# Patient Record
Sex: Female | Born: 1940 | Race: White | Hispanic: No | Smoking: Former smoker
Health system: Southern US, Community
[De-identification: ages and names within clinical notes are randomized; demographics above are authoritative.]

## PROBLEM LIST (undated history)

## (undated) DIAGNOSIS — E782 Mixed hyperlipidemia: Secondary | ICD-10-CM

## (undated) DIAGNOSIS — F0280 Dementia in other diseases classified elsewhere without behavioral disturbance: Principal | ICD-10-CM

## (undated) DIAGNOSIS — G309 Alzheimer's disease, unspecified: Secondary | ICD-10-CM

## (undated) DIAGNOSIS — I10 Essential (primary) hypertension: Secondary | ICD-10-CM

## (undated) DIAGNOSIS — F32A Depression, unspecified: Secondary | ICD-10-CM

## (undated) DIAGNOSIS — E119 Type 2 diabetes mellitus without complications: Secondary | ICD-10-CM

## (undated) DIAGNOSIS — F329 Major depressive disorder, single episode, unspecified: Secondary | ICD-10-CM

## (undated) DIAGNOSIS — F419 Anxiety disorder, unspecified: Secondary | ICD-10-CM

## (undated) HISTORY — PX: COLONOSCOPY: SHX174

## (undated) HISTORY — DX: Alzheimer's disease, unspecified: G30.9

## (undated) HISTORY — DX: Depression, unspecified: F32.A

## (undated) HISTORY — DX: Anxiety disorder, unspecified: F41.9

## (undated) HISTORY — PX: CATARACT EXTRACTION: SUR2

## (undated) HISTORY — DX: Type 2 diabetes mellitus without complications: E11.9

## (undated) HISTORY — DX: Essential (primary) hypertension: I10

## (undated) HISTORY — DX: Dementia in other diseases classified elsewhere without behavioral disturbance: F02.80

## (undated) HISTORY — DX: Major depressive disorder, single episode, unspecified: F32.9

## (undated) HISTORY — DX: Mixed hyperlipidemia: E78.2

## (undated) HISTORY — DX: Dementia in other diseases classified elsewhere, unspecified severity, without behavioral disturbance, psychotic disturbance, mood disturbance, and anxiety: F02.80

## (undated) HISTORY — PX: TUBAL LIGATION: SHX77

---

## 1998-06-29 ENCOUNTER — Ambulatory Visit (HOSPITAL_COMMUNITY): Admission: RE | Admit: 1998-06-29 | Discharge: 1998-06-29 | Payer: Self-pay | Admitting: Internal Medicine

## 1999-07-10 ENCOUNTER — Ambulatory Visit (HOSPITAL_COMMUNITY): Admission: RE | Admit: 1999-07-10 | Discharge: 1999-07-10 | Payer: Self-pay | Admitting: Internal Medicine

## 1999-07-10 ENCOUNTER — Encounter: Payer: Self-pay | Admitting: Internal Medicine

## 1999-12-10 ENCOUNTER — Encounter: Payer: Self-pay | Admitting: Internal Medicine

## 1999-12-10 ENCOUNTER — Ambulatory Visit (HOSPITAL_COMMUNITY): Admission: RE | Admit: 1999-12-10 | Discharge: 1999-12-10 | Payer: Self-pay | Admitting: Internal Medicine

## 2000-02-19 ENCOUNTER — Ambulatory Visit (HOSPITAL_COMMUNITY): Admission: RE | Admit: 2000-02-19 | Discharge: 2000-02-19 | Payer: Self-pay | Admitting: Gastroenterology

## 2000-02-19 ENCOUNTER — Encounter: Payer: Self-pay | Admitting: Gastroenterology

## 2000-02-29 ENCOUNTER — Encounter (INDEPENDENT_AMBULATORY_CARE_PROVIDER_SITE_OTHER): Payer: Self-pay | Admitting: Specialist

## 2000-02-29 ENCOUNTER — Ambulatory Visit (HOSPITAL_COMMUNITY): Admission: RE | Admit: 2000-02-29 | Discharge: 2000-02-29 | Payer: Self-pay | Admitting: Internal Medicine

## 2000-08-28 ENCOUNTER — Encounter: Payer: Self-pay | Admitting: Internal Medicine

## 2000-08-28 ENCOUNTER — Ambulatory Visit (HOSPITAL_COMMUNITY): Admission: RE | Admit: 2000-08-28 | Discharge: 2000-08-28 | Payer: Self-pay | Admitting: Internal Medicine

## 2001-07-16 ENCOUNTER — Encounter: Payer: Self-pay | Admitting: Internal Medicine

## 2001-07-16 ENCOUNTER — Ambulatory Visit (HOSPITAL_COMMUNITY): Admission: RE | Admit: 2001-07-16 | Discharge: 2001-07-16 | Payer: Self-pay | Admitting: Internal Medicine

## 2001-09-18 ENCOUNTER — Ambulatory Visit (HOSPITAL_COMMUNITY): Admission: RE | Admit: 2001-09-18 | Discharge: 2001-09-18 | Payer: Self-pay | Admitting: Internal Medicine

## 2001-09-18 ENCOUNTER — Encounter: Payer: Self-pay | Admitting: Internal Medicine

## 2001-12-30 ENCOUNTER — Other Ambulatory Visit: Admission: RE | Admit: 2001-12-30 | Discharge: 2001-12-30 | Payer: Self-pay | Admitting: Internal Medicine

## 2002-09-22 ENCOUNTER — Ambulatory Visit (HOSPITAL_COMMUNITY): Admission: RE | Admit: 2002-09-22 | Discharge: 2002-09-22 | Payer: Self-pay | Admitting: Internal Medicine

## 2002-09-22 ENCOUNTER — Encounter: Payer: Self-pay | Admitting: Internal Medicine

## 2003-03-30 ENCOUNTER — Ambulatory Visit (HOSPITAL_COMMUNITY): Admission: RE | Admit: 2003-03-30 | Discharge: 2003-03-30 | Payer: Self-pay | Admitting: Internal Medicine

## 2003-03-30 ENCOUNTER — Encounter: Payer: Self-pay | Admitting: Internal Medicine

## 2003-10-25 ENCOUNTER — Ambulatory Visit (HOSPITAL_COMMUNITY): Admission: RE | Admit: 2003-10-25 | Discharge: 2003-10-25 | Payer: Self-pay | Admitting: Internal Medicine

## 2004-02-20 ENCOUNTER — Other Ambulatory Visit: Admission: RE | Admit: 2004-02-20 | Discharge: 2004-02-20 | Payer: Self-pay | Admitting: Internal Medicine

## 2004-10-31 ENCOUNTER — Ambulatory Visit (HOSPITAL_COMMUNITY): Admission: RE | Admit: 2004-10-31 | Discharge: 2004-10-31 | Payer: Self-pay | Admitting: Internal Medicine

## 2004-11-08 ENCOUNTER — Encounter: Admission: RE | Admit: 2004-11-08 | Discharge: 2004-11-08 | Payer: Self-pay | Admitting: Internal Medicine

## 2005-12-18 ENCOUNTER — Ambulatory Visit (HOSPITAL_COMMUNITY): Admission: RE | Admit: 2005-12-18 | Discharge: 2005-12-18 | Payer: Self-pay | Admitting: Internal Medicine

## 2006-12-23 ENCOUNTER — Ambulatory Visit (HOSPITAL_COMMUNITY): Admission: RE | Admit: 2006-12-23 | Discharge: 2006-12-23 | Payer: Self-pay | Admitting: Internal Medicine

## 2007-12-28 ENCOUNTER — Ambulatory Visit (HOSPITAL_COMMUNITY): Admission: RE | Admit: 2007-12-28 | Discharge: 2007-12-28 | Payer: Self-pay | Admitting: Internal Medicine

## 2008-03-14 ENCOUNTER — Other Ambulatory Visit: Admission: RE | Admit: 2008-03-14 | Discharge: 2008-03-14 | Payer: Self-pay | Admitting: Internal Medicine

## 2008-03-31 ENCOUNTER — Encounter: Admission: RE | Admit: 2008-03-31 | Discharge: 2008-03-31 | Payer: Self-pay | Admitting: Internal Medicine

## 2008-04-18 LAB — HM DEXA SCAN

## 2008-11-25 HISTORY — PX: CARDIAC CATHETERIZATION: SHX172

## 2009-04-10 ENCOUNTER — Ambulatory Visit (HOSPITAL_COMMUNITY): Admission: RE | Admit: 2009-04-10 | Discharge: 2009-04-10 | Payer: Self-pay | Admitting: Internal Medicine

## 2009-05-16 ENCOUNTER — Encounter: Payer: Self-pay | Admitting: Cardiology

## 2009-08-29 ENCOUNTER — Encounter: Payer: Self-pay | Admitting: Cardiology

## 2009-09-08 ENCOUNTER — Ambulatory Visit: Payer: Self-pay | Admitting: Cardiology

## 2009-09-18 ENCOUNTER — Telehealth (INDEPENDENT_AMBULATORY_CARE_PROVIDER_SITE_OTHER): Payer: Self-pay | Admitting: *Deleted

## 2009-09-19 ENCOUNTER — Ambulatory Visit: Payer: Self-pay | Admitting: Cardiology

## 2009-09-19 ENCOUNTER — Encounter (HOSPITAL_COMMUNITY): Admission: RE | Admit: 2009-09-19 | Discharge: 2009-11-21 | Payer: Self-pay | Admitting: Cardiology

## 2009-09-19 ENCOUNTER — Ambulatory Visit: Payer: Self-pay

## 2009-09-29 ENCOUNTER — Ambulatory Visit: Payer: Self-pay | Admitting: Cardiology

## 2009-09-29 DIAGNOSIS — E782 Mixed hyperlipidemia: Secondary | ICD-10-CM | POA: Insufficient documentation

## 2009-10-02 ENCOUNTER — Encounter: Payer: Self-pay | Admitting: Cardiology

## 2009-10-02 LAB — CONVERTED CEMR LAB
BUN: 15 mg/dL (ref 6–23)
Basophils Absolute: 0.1 10*3/uL (ref 0.0–0.1)
Basophils Relative: 0.9 % (ref 0.0–3.0)
CO2: 30 meq/L (ref 19–32)
Calcium: 9.7 mg/dL (ref 8.4–10.5)
Chloride: 104 meq/L (ref 96–112)
Creatinine, Ser: 0.8 mg/dL (ref 0.4–1.2)
Eosinophils Absolute: 0.2 10*3/uL (ref 0.0–0.7)
Eosinophils Relative: 2.8 % (ref 0.0–5.0)
GFR calc non Af Amer: 75.93 mL/min (ref >60)
Glucose, Bld: 68 mg/dL — ABNORMAL LOW (ref 70–99)
HCT: 38.5 % (ref 36.0–46.0)
Hemoglobin: 13.1 g/dL (ref 12.0–15.0)
INR: 0.9 (ref 0.8–1.0)
Lymphocytes Relative: 35.7 % (ref 12.0–46.0)
Lymphs Abs: 2.3 10*3/uL (ref 0.7–4.0)
MCHC: 34.1 g/dL (ref 30.0–36.0)
MCV: 95.2 fL (ref 78.0–100.0)
Monocytes Absolute: 0.6 10*3/uL (ref 0.1–1.0)
Monocytes Relative: 9.3 % (ref 3.0–12.0)
Neutro Abs: 3.3 10*3/uL (ref 1.4–7.7)
Neutrophils Relative %: 51.3 % (ref 43.0–77.0)
Platelets: 316 10*3/uL (ref 150.0–400.0)
Potassium: 4 meq/L (ref 3.5–5.1)
Prothrombin Time: 9.7 s (ref 9.1–11.7)
RBC: 4.04 M/uL (ref 3.87–5.11)
RDW: 11.6 % (ref 11.5–14.6)
Sodium: 142 meq/L (ref 135–145)
WBC: 6.5 10*3/uL (ref 4.5–10.5)

## 2009-10-03 ENCOUNTER — Inpatient Hospital Stay (HOSPITAL_BASED_OUTPATIENT_CLINIC_OR_DEPARTMENT_OTHER): Admission: RE | Admit: 2009-10-03 | Discharge: 2009-10-03 | Payer: Self-pay | Admitting: Cardiology

## 2009-10-03 ENCOUNTER — Ambulatory Visit: Payer: Self-pay | Admitting: Cardiology

## 2009-10-25 ENCOUNTER — Encounter: Payer: Self-pay | Admitting: Cardiology

## 2009-10-26 ENCOUNTER — Ambulatory Visit: Payer: Self-pay | Admitting: Cardiology

## 2010-04-12 ENCOUNTER — Ambulatory Visit (HOSPITAL_COMMUNITY): Admission: RE | Admit: 2010-04-12 | Discharge: 2010-04-12 | Payer: Self-pay | Admitting: Internal Medicine

## 2010-05-15 ENCOUNTER — Encounter: Admission: RE | Admit: 2010-05-15 | Discharge: 2010-05-15 | Payer: Self-pay | Admitting: Neurology

## 2010-05-17 ENCOUNTER — Encounter: Payer: Self-pay | Admitting: Gastroenterology

## 2010-05-21 ENCOUNTER — Encounter (INDEPENDENT_AMBULATORY_CARE_PROVIDER_SITE_OTHER): Payer: Self-pay | Admitting: *Deleted

## 2010-06-15 ENCOUNTER — Encounter (INDEPENDENT_AMBULATORY_CARE_PROVIDER_SITE_OTHER): Payer: Self-pay | Admitting: *Deleted

## 2010-06-18 ENCOUNTER — Encounter (INDEPENDENT_AMBULATORY_CARE_PROVIDER_SITE_OTHER): Payer: Self-pay | Admitting: *Deleted

## 2010-06-18 ENCOUNTER — Ambulatory Visit: Payer: Self-pay | Admitting: Gastroenterology

## 2010-07-02 ENCOUNTER — Ambulatory Visit: Payer: Self-pay | Admitting: Gastroenterology

## 2010-12-27 NOTE — Letter (Signed)
Summary: Diabetic Instructions  [facility]  [address] [place], [place] [address]   Phone: [phone]  Fax: [phone]    Amy [name] 08/27/1941 MRN: [id number]   _  _   ORAL DIABETIC MEDICATION INSTRUCTIONS  The day before your procedure:   Take your diabetic pill as you do normally  The day of your procedure:   Do not take your diabetic pill    We will check your blood sugar levels during the admission process and again in Recovery before discharging you home  ________________________________________________________________________  _  _   INSULIN (LONG ACTING) MEDICATION INSTRUCTIONS (Lantus, NPH, 70/30, Humulin, Novolin-N)   The day before your procedure:   Take  your regular evening dose    The day of your procedure:   Do not take your morning dose    _  _   INSULIN (SHORT ACTING) MEDICATION INSTRUCTIONS (Regular, Humulog, Novolog)   The day before your procedure:   Do not take your evening dose   The day of your procedure:   Do not take your morning dose   _  _   INSULIN PUMP MEDICATION INSTRUCTIONS  We will contact the physician managing your diabetic care for written dosage instructions for the day before your procedure and the day of your procedure.  Once we have received the instructions, we will contact you.

## 2010-12-27 NOTE — Letter (Signed)
Summary: [facility] Instructions  [facility]  [address] [place], [place] [address]   Phone: [phone]  Fax: [phone]       Amy [name]    02-16-1941    MRN: [id number]        Procedure Day /Date:  07/02/10 Monday     Arrival Time:  8:30am      Procedure Time: 9:30am     Location of Procedure:                    _ x_  [facility] (4th Floor)                        PREPARATION FOR COLONOSCOPY WITH MOVIPREP   Starting 5 days prior to your procedure _ 5/3/11_ do not eat nuts, seeds, popcorn, corn, beans, peas,  salads, or any raw vegetables.  Do not take any fiber supplements (e.g. Metamucil, Citrucel, and Benefiber).  THE DAY BEFORE YOUR PROCEDURE         DATE:  07/01/10  DAY:  Sunday  1.  Drink clear liquids the entire day-NO SOLID FOOD  2.  Do not drink anything colored red or purple.  Avoid juices with pulp.  No orange juice.  3.  Drink at least 64 oz. (8 glasses) of fluid/clear liquids during the day to prevent dehydration and help the prep work efficiently.  CLEAR LIQUIDS INCLUDE: Water Jello Ice Popsicles Tea (sugar ok, no milk/cream) Powdered fruit flavored drinks Coffee (sugar ok, no milk/cream) Gatorade Juice: apple, white grape, white cranberry  Lemonade Clear bullion, consomm, broth Carbonated beverages (any kind) Strained chicken noodle soup Hard Candy                             4.  In the morning, mix first dose of MoviPrep solution:    Empty 1 Pouch A and 1 Pouch B into the disposable container    Add lukewarm drinking water to the top line of the container. Mix to dissolve    Refrigerate (mixed solution should be used within 24 hrs)  5.  Begin drinking the prep at 5:00 p.m. The MoviPrep container is divided by 4 marks.   Every 15 minutes drink the solution down to the next mark (approximately 8 oz) until the full liter is complete.   6.  Follow completed prep with 16 oz of clear liquid of your choice (Nothing  red or purple).  Continue to drink clear liquids until bedtime.  7.  Before going to bed, mix second dose of MoviPrep solution:    Empty 1 Pouch A and 1 Pouch B into the disposable container    Add lukewarm drinking water to the top line of the container. Mix to dissolve    Refrigerate  THE DAY OF YOUR PROCEDURE      DATE:   07/02/10  DAY:  Monday  Beginning at  4:30 a.m. (5 hours before procedure):         1. Every 15 minutes, drink the solution down to the next mark (approx 8 oz) until the full liter is complete.  2. Follow completed prep with 16 oz. of clear liquid of your choice.    3. You may drink clear liquids until  7:30am (2 HOURS BEFORE PROCEDURE).   MEDICATION INSTRUCTIONS  Unless otherwise instructed, you should take regular prescription medications with a small sip of water  as early as possible the morning of your procedure.  Diabetic patients - see separate instructions.  Stop taking Plavix or Aggrenox on  _  _  (7 days before procedure).     Stop taking Coumadin on  _ _  (5 days before procedure).  Additional medication instructions: _         OTHER INSTRUCTIONS  You will need a responsible adult at least 69 years of age to accompany you and drive you home.   This person must remain in the waiting room during your procedure.  Wear loose fitting clothing that is easily removed.  Leave jewelry and other valuables at home.  However, you may wish to bring a book to read or  an iPod/MP3 player to listen to music as you wait for your procedure to start.  Remove all body piercing jewelry and leave at home.  Total time from sign-in until discharge is approximately 2-3 hours.  You should go home directly after your procedure and rest.  You can resume normal activities the  day after your procedure.  The day of your procedure you should not:   Drive   Make legal decisions   Operate machinery   Drink alcohol   Return to work  You will receive  specific instructions about eating, activities and medications before you leave.    The above instructions have been reviewed and explained to me by   _______________________    I fully understand and can verbalize these instructions _____________________________ Date _________

## 2010-12-27 NOTE — Letter (Signed)
Summary: Previsit letter  [facility]  [address] [place], [place] [address]   Phone: [phone]  Fax: [phone]       05/21/2010 MRN: [id number]  [name] [address] [place], [place]  [address]  Dear Ms. Amy [name],  Welcome to the Gastroenterology Division at [facility].    You are scheduled to see a nurse for your pre-procedure visit on 06-18-10 at 1:30p.m. on the 3rd floor at [facility], [address]  We ask that you try to arrive at our office 15 minutes prior to your appointment time to allow for check-in.  Your nurse visit will consist of discussing your medical and surgical history, your immediate family medical history, and your medications.    Please bring a complete list of all your medications or, if you prefer, bring the medication bottles and we will list them.  We will need to be aware of both prescribed and over the counter drugs.  We will need to know exact dosage information as well.  If you are on blood thinners (Coumadin, Plavix, Aggrenox, Ticlid, etc.) please call our office today/prior to your appointment, as we need to consult with your physician about holding your medication.   Please be prepared to read and sign documents such as consent forms, a financial agreement, and acknowledgement forms.  If necessary, and with your consent, a friend or relative is welcome to sit-in on the nurse visit with you.  Please bring your insurance card so that we may make a copy of it.  If your insurance requires a referral to see a specialist, please bring your referral form from your primary care physician.  No co-pay is required for this nurse visit.     If you cannot keep your appointment, please call [phone] to cancel or reschedule prior to your appointment date.  This allows [place] the opportunity to schedule an appointment for another patient in need of care.    Thank you for choosing [facility] for your medical  needs.  We appreciate the opportunity to care for you.  Please visit [place] at our website  to learn more about our practice.                     Sincerely.                                                                                                                   The Gastroenterology Division

## 2010-12-27 NOTE — Letter (Signed)
Summary: [facility] Instructions  [facility]  [address] [place], [place] [address]   Phone: [phone]  Fax: [phone]       Annamaria Herzig    27-Jun-1954    MRN: [id number]        Procedure Day Dorna Bloom   07/02/10  Monday     Arrival Time:  8:30am      Procedure Time: 9:30am     Location of Procedure:                    _x _  [facility] (4th Floor)                        PREPARATION FOR COLONOSCOPY WITH MOVIPREP   Starting 5 days prior to your procedure _8/3/11 _ do not eat nuts, seeds, popcorn, corn, beans, peas,  salads, or any raw vegetables.  Do not take any fiber supplements (e.g. Metamucil, Citrucel, and Benefiber).  THE DAY BEFORE YOUR PROCEDURE         DATE:  07/01/10  DAY: Sunday  1.  Drink clear liquids the entire day-NO SOLID FOOD  2.  Do not drink anything colored red or purple.  Avoid juices with pulp.  No orange juice.  3.  Drink at least 64 oz. (8 glasses) of fluid/clear liquids during the day to prevent dehydration and help the prep work efficiently.  CLEAR LIQUIDS INCLUDE: Water Jello Ice Popsicles Tea (sugar ok, no milk/cream) Powdered fruit flavored drinks Coffee (sugar ok, no milk/cream) Gatorade Juice: apple, white grape, white cranberry  Lemonade Clear bullion, consomm, broth Carbonated beverages (any kind) Strained chicken noodle soup Hard Candy                             4.  In the morning, mix first dose of MoviPrep solution:    Empty 1 Pouch A and 1 Pouch B into the disposable container    Add lukewarm drinking water to the top line of the container. Mix to dissolve    Refrigerate (mixed solution should be used within 24 hrs)  5.  Begin drinking the prep at 5:00 p.m. The MoviPrep container is divided by 4 marks.   Every 15 minutes drink the solution down to the next mark (approximately 8 oz) until the full liter is complete.   6.  Follow completed prep with 16 oz of clear liquid of your choice (Nothing  red or purple).  Continue to drink clear liquids until bedtime.  7.  Before going to bed, mix second dose of MoviPrep solution:    Empty 1 Pouch A and 1 Pouch B into the disposable container    Add lukewarm drinking water to the top line of the container. Mix to dissolve    Refrigerate  THE DAY OF YOUR PROCEDURE      DATE:  07/02/10  DAY:  Monday  Beginning at   4:30 a.m. (5 hours before procedure):         1. Every 15 minutes, drink the solution down to the next mark (approx 8 oz) until the full liter is complete.  2. Follow completed prep with 16 oz. of clear liquid of your choice.    3. You may drink clear liquids until 7:30am  (2 HOURS BEFORE PROCEDURE).   MEDICATION INSTRUCTIONS  Unless otherwise instructed, you should take regular prescription medications with a small sip of  water   as early as possible the morning of your procedure.  Diabetic patients - see separate instructions.           OTHER INSTRUCTIONS  You will need a responsible adult at least 69 years of age to accompany you and drive you home.   This person must remain in the waiting room during your procedure.  Wear loose fitting clothing that is easily removed.  Leave jewelry and other valuables at home.  However, you may wish to bring a book to read or  an iPod/MP3 player to listen to music as you wait for your procedure to start.  Remove all body piercing jewelry and leave at home.  Total time from sign-in until discharge is approximately 2-3 hours.  You should go home directly after your procedure and rest.  You can resume normal activities the  day after your procedure.  The day of your procedure you should not:   Drive   Make legal decisions   Operate machinery   Drink alcohol   Return to work  You will receive specific instructions about eating, activities and medications before you leave.    The above instructions have been reviewed and explained to me by   Clide Cliff,  RN______________________    I fully understand and can verbalize these instructions _____________________________ Date _________

## 2010-12-27 NOTE — Procedures (Signed)
Summary: Colonoscopy  Patient: Amy [name] Note: All result statuses are Final unless otherwise noted.  Tests: (1) Colonoscopy (COL)   COL Colonoscopy           DONE     [facility]     [address]     [place], [place]  [address]           COLONOSCOPY PROCEDURE REPORT           PATIENT:  Amy [name]  MR#:  [id number]     BIRTHDATE:  [birth year], 69 yrs. old  GENDER:  female           ENDOSCOPIST:  Barbette Hair. Arlyce Dice, MD     Referred by:           PROCEDURE DATE:  07/02/2010     PROCEDURE:  Diagnostic Colonoscopy     ASA CLASS:  Class II     INDICATIONS:  1) screening  2) history of pre-cancerous     (adenomatous) colon polyps           MEDICATIONS:   Fentanyl 50 mcg IV, Versed 3 mg IV           DESCRIPTION OF PROCEDURE:   After the risks benefits and     alternatives of the procedure were thoroughly explained, informed     consent was obtained.  Digital rectal exam was performed and     revealed no abnormalities.   The LB CF-[id number] endoscope     was introduced through the anus and advanced to the cecum, which     was identified by both the appendix and ileocecal valve, without     limitations.  The quality of the prep was excellent, using     MoviPrep.  The instrument was then slowly withdrawn as the colon     was fully examined.     <<PROCEDUREIMAGES>>           FINDINGS:  Diverticula were found in the ascending colon (see     image4). Few diverticula  Moderate diverticulosis was found in the     sigmoid colon (see image15).  This was otherwise a normal     examination of the colon (see image2, image3, image5, image7,     image9, image11, image16, and image17).   Retroflexed views in the     rectum revealed no abnormalities.    The time to cecum =  4.0     minutes. The scope was then withdrawn (time =  8.5  min) from the     patient and the procedure completed.           COMPLICATIONS:  None           ENDOSCOPIC IMPRESSION:     1) Diverticula in  the ascending colon     2) Moderate diverticulosis in the sigmoid colon     3) Otherwise normal examination     RECOMMENDATIONS:     1) Colonoscopy 10 years           REPEAT EXAM:  In 10 year(s) for Colonoscopy.           ______________________________     Barbette Hair. Arlyce Dice, MD           CC: Amy Morale, MD           n.     Amy [name]:   Barbette Hair. Kaplan at 07/02/2010 10:03 AM  Amy [name], [id number]  Note: An exclamation mark (!) indicates a result that was not dispersed into the flowsheet. Document Creation Date: 07/02/2010 10:04 AM _______________________________________________________________________  (1) Order result status: Final Collection or observation date-time: 07/02/2010 09:58 Requested date-time:  Receipt date-time:  Reported date-time:  Referring Physician:   Ordering Physician: Melvia Heaps [phone]) Specimen Source:  Source: Launa Grill Order Number: [id number] Lab site:   Appended Document: Colonoscopy    Clinical Lists Changes  Observations: Added new observation of COLONNXTDUE: 06/2020 (07/02/2010 14:16)

## 2010-12-27 NOTE — Letter (Signed)
Summary: Diabetic Instructions  [facility]  [address]   [place], [place] [address]   Phone: [phone]  Fax: [phone]    Amy [name] 03/03/1941 MRN: [id number]   _  _   ORAL DIABETIC MEDICATION INSTRUCTIONS  The day before your procedure:   Take your diabetic pill as you do normally  The day of your procedure:   Do not take your diabetic pill    We will check your blood sugar levels during the admission process and again in Recovery before discharging you home  ________________________________________________________________________  _  _   INSULIN (LONG ACTING) MEDICATION INSTRUCTIONS (Lantus, NPH, 70/30, Humulin, Novolin-N)   The day before your procedure:   Take  your regular evening dose    The day of your procedure:   Do not take your morning dose    _  _   INSULIN (SHORT ACTING) MEDICATION INSTRUCTIONS (Regular, Humulog, Novolog)   The day before your procedure:   Do not take your evening dose   The day of your procedure:   Do not take your morning dose   _  _   INSULIN PUMP MEDICATION INSTRUCTIONS  We will contact the physician managing your diabetic care for written dosage instructions for the day before your procedure and the day of your procedure.  Once we have received the instructions, we will contact you.  

## 2010-12-27 NOTE — Miscellaneous (Signed)
Summary: recall colon--ch  Clinical Lists Changes  Medications: Added new medication of MOVIPREP 100 GM  SOLR (PEG-KCL-NACL-NASULF-NA ASC-C) As directed - Signed Rx of MOVIPREP 100 GM  SOLR (PEG-KCL-NACL-NASULF-NA ASC-C) As directed;  #1 x 0;  Signed;  Entered by: Clide Cliff RN;  Authorized by: Louis Meckel MD;  Method used: Electronically to [facility] [address] # [id number]*, [address] [place], [place], [place]  [address], Ph: [phone] or [phone], Fax: [phone]    Prescriptions: MOVIPREP 100 GM  SOLR (PEG-KCL-NACL-NASULF-NA ASC-C) As directed  #1 x 0   Entered by:   Clide Cliff RN   Authorized by:   Louis Meckel MD   Signed by:   Clide Cliff RN on 06/18/2010   Method used:   Electronically to        [facility] [address] [id number] (retail)       [address]       [place], [place]  [address]       Ph: [phone] or [phone]       Fax: [phone]   RxID:   [id number]

## 2010-12-27 NOTE — Letter (Signed)
Summary: Marinus Maw MD  Marinus Maw MD   Imported By: Lester [place] 05/30/2010 09:33:22  _____________________________________________________________________  External Attachment:    Type:   Image     Comment:   External Document

## 2011-01-18 ENCOUNTER — Emergency Department (HOSPITAL_COMMUNITY): Payer: Medicare Other

## 2011-01-18 ENCOUNTER — Inpatient Hospital Stay (HOSPITAL_COMMUNITY)
Admission: EM | Admit: 2011-01-18 | Discharge: 2011-01-19 | DRG: 639 | Disposition: A | Discharge status: Home / Self Care | Payer: Medicare Other | Attending: Internal Medicine | Admitting: Internal Medicine

## 2011-01-18 DIAGNOSIS — E1169 Type 2 diabetes mellitus with other specified complication: Principal | ICD-10-CM | POA: Diagnosis present

## 2011-01-18 DIAGNOSIS — Y9229 Other specified public building as the place of occurrence of the external cause: Secondary | ICD-10-CM

## 2011-01-18 DIAGNOSIS — R55 Syncope and collapse: Secondary | ICD-10-CM | POA: Diagnosis present

## 2011-01-18 DIAGNOSIS — I10 Essential (primary) hypertension: Secondary | ICD-10-CM | POA: Diagnosis present

## 2011-01-18 DIAGNOSIS — T383X5A Adverse effect of insulin and oral hypoglycemic [antidiabetic] drugs, initial encounter: Secondary | ICD-10-CM | POA: Diagnosis present

## 2011-01-18 LAB — GLUCOSE, CAPILLARY
Glucose-Capillary: 144 mg/dL — ABNORMAL HIGH (ref 70–99)
Glucose-Capillary: 136 mg/dL — ABNORMAL HIGH (ref 70–99)

## 2011-01-18 LAB — CBC
HCT: 35.9 % — ABNORMAL LOW (ref 36.0–46.0)
Hemoglobin: 11.6 g/dL — ABNORMAL LOW (ref 12.0–15.0)
MCH: 29.7 pg (ref 26.0–34.0)
MCHC: 32.3 g/dL (ref 30.0–36.0)
MCV: 92.1 fL (ref 78.0–100.0)
Platelets: 304 10*3/uL (ref 150–400)
RBC: 3.9 MIL/uL (ref 3.87–5.11)
RDW: 12.7 % (ref 11.5–15.5)
WBC: 8.6 10*3/uL (ref 4.0–10.5)

## 2011-01-18 LAB — CK TOTAL AND CKMB (NOT AT ARMC)
CK, MB: 2.2 ng/mL (ref 0.3–4.0)
CK, MB: 1.8 ng/mL (ref 0.3–4.0)
Relative Index: INVALID (ref 0.0–2.5)
Total CK: 61 U/L (ref 7–177)

## 2011-01-18 LAB — BASIC METABOLIC PANEL
BUN: 13 mg/dL (ref 6–23)
CO2: 25 mEq/L (ref 19–32)
Calcium: 9 mg/dL (ref 8.4–10.5)
Creatinine, Ser: 0.75 mg/dL (ref 0.4–1.2)
GFR calc non Af Amer: >60 mL/min (ref >60)
Glucose, Bld: 95 mg/dL (ref 70–99)
Potassium: 4 mEq/L (ref 3.5–5.1)
Sodium: 138 mEq/L (ref 135–145)

## 2011-01-18 LAB — DIFFERENTIAL
Basophils Absolute: 0.1 10*3/uL (ref 0.0–0.1)
Basophils Relative: 1 % (ref 0–1)
Eosinophils Absolute: 0.1 10*3/uL (ref 0.0–0.7)
Eosinophils Relative: 1 % (ref 0–5)
Lymphocytes Relative: 22 % (ref 12–46)
Lymphs Abs: 1.9 10*3/uL (ref 0.7–4.0)
Monocytes Absolute: 0.9 10*3/uL (ref 0.1–1.0)
Neutrophils Relative %: 66 % (ref 43–77)

## 2011-01-18 LAB — URINALYSIS, ROUTINE W REFLEX MICROSCOPIC
Bilirubin Urine: NEGATIVE
Ketones, ur: NEGATIVE mg/dL
Nitrite: NEGATIVE
Protein, ur: NEGATIVE mg/dL
Urine Glucose, Fasting: NEGATIVE mg/dL
Urobilinogen, UA: 0.2 mg/dL (ref 0.0–1.0)
pH: 6.5 (ref 5.0–8.0)

## 2011-01-18 LAB — CARDIAC PANEL(CRET KIN+CKTOT+MB+TROPI)
CK, MB: 1.8 ng/mL (ref 0.3–4.0)
Relative Index: INVALID (ref 0.0–2.5)
Total CK: 68 U/L (ref 7–177)

## 2011-01-18 LAB — TROPONIN I
Troponin I: 0.01 ng/mL (ref 0.00–0.06)
Troponin I: <0.01 ng/mL (ref 0.00–0.06)

## 2011-01-19 LAB — GLUCOSE, CAPILLARY
Glucose-Capillary: 93 mg/dL (ref 70–99)
Glucose-Capillary: 104 mg/dL — ABNORMAL HIGH (ref 70–99)

## 2011-01-19 LAB — CARDIAC PANEL(CRET KIN+CKTOT+MB+TROPI)
CK, MB: 1.6 ng/mL (ref 0.3–4.0)
Total CK: 55 U/L (ref 7–177)

## 2011-01-31 NOTE — Discharge Summary (Signed)
Amy [name], Amy [name]               ACCOUNT NO.:  [id number]  MEDICAL RECORD NO.:  [id number]           PATIENT TYPE:  I  LOCATION:  1426                         FACILITY:  [facility]  PHYSICIAN:  Ladell Pier, M.D.   DATE OF BIRTH:  Apr 15, 1941  DATE OF ADMISSION:  01/18/2011 DATE OF DISCHARGE:  01/19/2011                              DISCHARGE SUMMARY   DISCHARGE DIAGNOSES: 1. Syncopal episode secondary to hypoglycemia. 2. Diabetes. 3. Hypertension. 4. History of chest pain in 2010 with cardiac catheterization done     that was negative. 5. Family history of breast cancer.  DISCHARGE MEDICATIONS: 1. Aspirin 81 mg daily. 2. Bumetanide 0.5 mg daily. 3. Enalapril 20 mg daily. 4. Fenofibrate 134 mg daily. 5. Fish oil one tablet daily. 6. Magnesium OTC three times a day. 7. Metformin XR 500 mg four times a day. 8. Multivitamin daily. 9. Tamoxifen 20 mg daily. 10.Vitamin B12 over-the-counter one tablet daily. 11.Vitamin D3 2000 international units four times daily.  FOLLOWUP APPOINTMENTS:  The patient to follow up with Dr. Oneta Rack either Monday or Tuesday.  PRIMARY CARE PHYSICIAN:  Lucky Cowboy, M.D.  PROCEDURES:  Head CT and chest x-ray are both negative.  CONSULTATIONS:  None.  HISTORY OF PRESENT ILLNESS:  The patient is a 70 year old female with past medical history significant for diabetes and hypertension.  The patient stated that she had some fruit this morning and coffee and [facility].  She also took her [facility]  She had just finished some aerobic chair exercises and was getting ready to go to another class when she passed out while sitting in the chair.  The chair fell over with her and she bumped her head on the left forehead.  EMS was called and she was noted to have a blood sugar of 58.  The patient had no chest pain, no shortness of breath, no nausea, no vomiting and no prodrome prior to  her getting the symptoms.  She did not bite her tongue.  PAST MEDICAL HISTORY:  Per admission H and P.  FAMILY HISTORY:  Per admission H and P.  SOCIAL HISTORY:  Per admission H and P.  MEDICATIONS:  Per admission H and P.  ALLERGIES:  Per admission H and P.  REVIEW OF SYSTEMS:  Per admission H and P.  PHYSICAL EXAMINATION AT TIME OF DISCHARGE:  VITAL SIGNS:  Temperature 98, pulse of 75, respirations 16, blood pressure 132/74, pulse oximetry of 98% on room air. GENERAL:  The patient is sitting up in bed, well-nourished white female. HEENT:  Normocephalic, atraumatic.  Pupils reactive to light.  Throat without erythema.  She has a small hematoma on the left upper left forehead region. CARDIOVASCULAR:  Regular rate and rhythm. LUNGS:  Clear bilaterally. ABDOMEN:  Positive bowel sounds. EXTREMITIES:  Without edema.  HOSPITAL COURSE: 1. Syncopal episode:  Her syncopal episode is most likely due to     hypoglycemia from medication.  She took her medications and did not     really eat a big breakfast and went to  work out, so the     hypoglycemic episode is probably from her medication.  She did not     have any chest pain.  No shortness of breath.  No headache.  No     stroke symptoms.  No seizure symptoms.  We will hold her glyburide     for now, continue the metformin and she will follow with Dr.     Oneta Rack this week coming up to see if he wants to do any further     exam, but here her EKG is normal, cardiac markers are normal, head     CT is normal.  We will not do any further workup. 2. Diabetes:  Continued her metformin.  Her blood sugars have been     running fine here with just the metformin, so we will hold off on     the glyburide until she follows up with Dr. Oneta Rack.  Her sugars     are 93 to 104.  We did not do a hemoglobin A1c. 3. Hypertension:  Continue her on her home medications.  DISCHARGE LABORATORIES:  CK 56, MB 1.6, troponin 0.01, TSH 3.539.  BNP 138,  potassium 4.0, chloride 105, CO2 25, glucose 95, BUN 13, creatinine 0.75.  WBC 8.6, hemoglobin 11.6, MCV 92.1, platelets 304.  Time spent with the patient and daughter and doing this discharge is approximately 35 minutes.     Ladell Pier, M.D.     [place]/MEDQ  D:  01/19/2011  T:  01/19/2011  Job:  [id number]  cc:   Lucky Cowboy, M.D. Fax: [phone]  Electronically Signed by Ladell Pier M.D. on 01/31/2011 07:56:00 AM

## 2011-02-01 NOTE — H&P (Signed)
Amy [name], LATINO               ACCOUNT NO.:  [id number]  MEDICAL RECORD NO.:  [id number]           PATIENT TYPE:  E  LOCATION:  WLED                         FACILITY:  [facility]  PHYSICIAN:  Ladell Pier, M.D.   DATE OF BIRTH:  07-09-1941  DATE OF ADMISSION:  01/18/2011 DATE OF DISCHARGE:                             HISTORY & PHYSICAL   CHIEF COMPLAINT:  [facility]  HISTORY OF PRESENT ILLNESS:  The patient is a 70 year old female with past medical history significant for diabetes, hypertension.  The patient stated that she had some fruit this morning and some coffee and [facility].  [facility] do [facility] aerobics and right after she finished her exercises from that class, she was going to go to another class because she had missed it a few days.  She stated the next thing I know people were standing over her.  She was on the floor.  They told her that she just passed out and the chair fell over with her.  She hit her head.  She does not remember any chest pain.  No shortness of breath.  No prodrome of the fall.  EMS was called and she was noted to have a blood sugar of 58.  She stated that she did take her metformin and her glyburide prior to going to her exercise.  PAST MEDICAL HISTORY:  Significant for, 1. Diabetes. 2. Hypertension. 3. She had chest pain in 2010, had a cardiac cath that was negative     and a family history of breast cancer.  FAMILY HISTORY:  Mother died at 39 of breast cancer.  Father died of a stroke at 60.  SOCIAL HISTORY:  She does not smoke or drink alcohol.  She is widowed. She has 2 children.  She is retired.  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  MEDICATIONS: 1. Magnesium over-the-counter three times a day. 2. Vitamin B12 daily. 3. Multivitamin daily. 4. Fish oil daily. 5. Vitamin D3 2000 units four times daily. 6. Aspirin 81 mg daily. 7. Tamoxifen 20 mg every evening. 8. Bumetanide 0.5 mg  daily. 9. Glyburide 5 mg half a tablet twice daily. 10.Enalapril 20 mg every evening. 11.Metformin XR 500 mg four times daily. 12.Fenofibrate 134 mg daily.  ALLERGIES:  LATEX AND SHELLFISH.  REVIEW OF SYSTEMS:  Negative otherwise stated in HPI.  PHYSICAL EXAM:  VITAL SIGNS:  Temperature 99, pulse of 77, respirations 20, blood pressure 149/56, pulse of 97% on room air. GENERAL:  The patient is sitting up on a stretcher, well-nourished white female. HEENT:  Head is normocephalic.  She does have a hematoma on the left forehead.  Throat without erythema. CARDIOVASCULAR:  Regular rate and rhythm with 1/6 systolic murmur. LUNGS:  Clear bilaterally.  No wheezes, rhonchi, or rales. ABDOMEN:  Soft, nontender, nondistended.  Positive bowel sounds. EXTREMITIES:  Without edema. NEURO:  Nonfocal.  Strength 5/5 throughout.  Cranial nerves II through XII intact.  LABORATORY DATA:  CBC; WBC 8.6, hemoglobin 11.6, MCV 96.1, platelets 304.  CK 81, MB 2.6.  Troponin 0.01.  UA negative.  Sodium 138, potassium 4.0, chloride 105, CO2 of 25, glucose 25, BUN 30, creatinine 0.75.  IMAGING:  Chest x-ray, no evidence of active pulmonary disease.  CT of the head, no acute intracranial abnormality.  Mild left frontal soft tissue swelling.  EKG, no acute ST-segment elevation or depression.  ASSESSMENT AND PLAN: 1. Syncopal episode secondary to hypoglycemia from exercise after     taking her diabetes medication. 2. Hypertension.  We will admit the patient to the hospital.  I     suspect that the syncopal episode is from the low blood sugar from     her taking glyburide and then working out and her blood sugars drop     in.  Did discuss with her that in the future either she have to do     some carb loading prior to her exercise or take the glyburide after     her exercise when she is going to have breakfast.  I do not suspect     she had a seizure.  She did not have any sort of jerking movements     and  her head CT is negative.  I thought she has a stroke.  Since it     causes obvious for her syncopal episode, we will defer any further     workup to Dr. Shirlee Latch.  Did discuss with family when they are     discharged on Saturday, to follow through Dr. Shirlee Latch on Monday or     Tuesday after discharge from the hospital.  We will continue her     home medications except for the glyburide for now and continue to     check Accu-Cheks q.4 h x12 hours and a.c. and h.s.  Time spent with the patient and daughter and doing this admission is approximately 45 minutes.     Ladell Pier, M.D.     [place]/MEDQ  D:  01/18/2011  T:  01/18/2011  Job:  [id number]  Electronically Signed by Ladell Pier M.D. on 01/31/2011 07:56:04 AM

## 2011-02-08 LAB — GLUCOSE, CAPILLARY: Glucose-Capillary: 114 mg/dL — ABNORMAL HIGH (ref 70–99)

## 2011-06-04 ENCOUNTER — Encounter: Payer: Self-pay | Admitting: Cardiology

## 2011-07-11 ENCOUNTER — Other Ambulatory Visit (HOSPITAL_COMMUNITY): Payer: Self-pay | Admitting: Internal Medicine

## 2011-07-11 DIAGNOSIS — Z1231 Encounter for screening mammogram for malignant neoplasm of breast: Secondary | ICD-10-CM

## 2011-07-17 ENCOUNTER — Ambulatory Visit (HOSPITAL_COMMUNITY)
Admission: RE | Admit: 2011-07-17 | Discharge: 2011-07-17 | Disposition: A | Discharge status: Home / Self Care | Payer: Medicare Other | Source: Ambulatory Visit | Attending: Internal Medicine | Admitting: Internal Medicine

## 2011-07-17 DIAGNOSIS — Z1231 Encounter for screening mammogram for malignant neoplasm of breast: Secondary | ICD-10-CM | POA: Insufficient documentation

## 2012-04-22 ENCOUNTER — Other Ambulatory Visit: Payer: Self-pay | Admitting: Internal Medicine

## 2012-04-22 DIAGNOSIS — R413 Other amnesia: Secondary | ICD-10-CM

## 2012-04-24 ENCOUNTER — Ambulatory Visit
Admission: RE | Admit: 2012-04-24 | Discharge: 2012-04-24 | Disposition: A | Discharge status: Home / Self Care | Payer: Medicare Other | Source: Ambulatory Visit | Attending: Internal Medicine | Admitting: Internal Medicine

## 2012-04-24 DIAGNOSIS — R413 Other amnesia: Secondary | ICD-10-CM

## 2012-06-23 ENCOUNTER — Other Ambulatory Visit (HOSPITAL_COMMUNITY): Payer: Self-pay | Admitting: Internal Medicine

## 2012-06-23 DIAGNOSIS — Z1231 Encounter for screening mammogram for malignant neoplasm of breast: Secondary | ICD-10-CM

## 2012-07-22 ENCOUNTER — Ambulatory Visit (HOSPITAL_COMMUNITY)
Admission: RE | Admit: 2012-07-22 | Discharge: 2012-07-22 | Disposition: A | Discharge status: Home / Self Care | Payer: Medicare Other | Source: Ambulatory Visit | Attending: Internal Medicine | Admitting: Internal Medicine

## 2012-07-22 DIAGNOSIS — Z1231 Encounter for screening mammogram for malignant neoplasm of breast: Secondary | ICD-10-CM | POA: Insufficient documentation

## 2013-07-08 ENCOUNTER — Other Ambulatory Visit (HOSPITAL_COMMUNITY): Payer: Self-pay | Admitting: Internal Medicine

## 2013-07-08 DIAGNOSIS — Z1231 Encounter for screening mammogram for malignant neoplasm of breast: Secondary | ICD-10-CM

## 2013-07-29 ENCOUNTER — Encounter (HOSPITAL_COMMUNITY): Payer: Self-pay | Admitting: *Deleted

## 2013-07-29 ENCOUNTER — Emergency Department (HOSPITAL_COMMUNITY): Payer: Medicare Other

## 2013-07-29 ENCOUNTER — Emergency Department (HOSPITAL_COMMUNITY)
Admission: EM | Admit: 2013-07-29 | Discharge: 2013-07-29 | Disposition: A | Discharge status: Home / Self Care | Payer: Medicare Other | Attending: Emergency Medicine | Admitting: Emergency Medicine

## 2013-07-29 DIAGNOSIS — R1110 Vomiting, unspecified: Secondary | ICD-10-CM

## 2013-07-29 DIAGNOSIS — E119 Type 2 diabetes mellitus without complications: Secondary | ICD-10-CM | POA: Insufficient documentation

## 2013-07-29 DIAGNOSIS — E782 Mixed hyperlipidemia: Secondary | ICD-10-CM | POA: Insufficient documentation

## 2013-07-29 DIAGNOSIS — Z91040 Latex allergy status: Secondary | ICD-10-CM | POA: Insufficient documentation

## 2013-07-29 DIAGNOSIS — R112 Nausea with vomiting, unspecified: Secondary | ICD-10-CM | POA: Insufficient documentation

## 2013-07-29 DIAGNOSIS — Z87891 Personal history of nicotine dependence: Secondary | ICD-10-CM | POA: Insufficient documentation

## 2013-07-29 DIAGNOSIS — R109 Unspecified abdominal pain: Secondary | ICD-10-CM

## 2013-07-29 DIAGNOSIS — Z79899 Other long term (current) drug therapy: Secondary | ICD-10-CM | POA: Insufficient documentation

## 2013-07-29 DIAGNOSIS — Z7982 Long term (current) use of aspirin: Secondary | ICD-10-CM | POA: Insufficient documentation

## 2013-07-29 DIAGNOSIS — I10 Essential (primary) hypertension: Secondary | ICD-10-CM | POA: Insufficient documentation

## 2013-07-29 DIAGNOSIS — R1013 Epigastric pain: Secondary | ICD-10-CM | POA: Insufficient documentation

## 2013-07-29 DIAGNOSIS — R197 Diarrhea, unspecified: Secondary | ICD-10-CM | POA: Insufficient documentation

## 2013-07-29 LAB — URINALYSIS, ROUTINE W REFLEX MICROSCOPIC
Bilirubin Urine: NEGATIVE
Glucose, UA: NEGATIVE mg/dL
Hgb urine dipstick: NEGATIVE
Ketones, ur: NEGATIVE mg/dL
Protein, ur: NEGATIVE mg/dL
Urobilinogen, UA: 0.2 mg/dL (ref 0.0–1.0)

## 2013-07-29 LAB — COMPREHENSIVE METABOLIC PANEL
ALT: 17 U/L (ref 0–35)
BUN: 18 mg/dL (ref 6–23)
CO2: 27 mEq/L (ref 19–32)
Calcium: 9.4 mg/dL (ref 8.4–10.5)
GFR calc Af Amer: >90 mL/min (ref >90)
GFR calc non Af Amer: 88 mL/min — ABNORMAL LOW (ref >90)
Glucose, Bld: 146 mg/dL — ABNORMAL HIGH (ref 70–99)
Total Protein: 6.7 g/dL (ref 6.0–8.3)

## 2013-07-29 LAB — CBC WITH DIFFERENTIAL/PLATELET
Eosinophils Absolute: 0.2 10*3/uL (ref 0.0–0.7)
Eosinophils Relative: 2 % (ref 0–5)
HCT: 39 % (ref 36.0–46.0)
Hemoglobin: 13.3 g/dL (ref 12.0–15.0)
Lymphocytes Relative: 17 % (ref 12–46)
Lymphs Abs: 1.8 10*3/uL (ref 0.7–4.0)
MCH: 31.1 pg (ref 26.0–34.0)
MCV: 91.3 fL (ref 78.0–100.0)
Monocytes Absolute: 0.9 10*3/uL (ref 0.1–1.0)
Monocytes Relative: 8 % (ref 3–12)
RBC: 4.27 MIL/uL (ref 3.87–5.11)
WBC: 11 10*3/uL — ABNORMAL HIGH (ref 4.0–10.5)

## 2013-07-29 LAB — URINE MICROSCOPIC-ADD ON

## 2013-07-29 LAB — LIPASE, BLOOD: Lipase: 33 U/L (ref 11–59)

## 2013-07-29 LAB — POCT I-STAT TROPONIN I: Troponin i, poc: 0.01 ng/mL (ref 0.00–0.08)

## 2013-07-29 MED ORDER — ONDANSETRON HCL 4 MG/2ML IJ SOLN: 4.0000 mg | Freq: Once | INTRAMUSCULAR | Status: DC

## 2013-07-29 NOTE — ED Notes (Signed)
Patient called EMS for mid sternal chest pain that started about 9pm.  EMS adm 4 81mg  ASA, initiated an IV   When EMS arrived, she stated that the pain and nausea has gone away

## 2013-07-29 NOTE — ED Notes (Signed)
Patient denies any pain or nausea.  Attempted to urinate but unable to.  Amb to and from the BR with out any difficulty.

## 2013-07-29 NOTE — ED Provider Notes (Signed)
TIME SEEN: 2:37 AM  CHIEF COMPLAINT: Chest pain, abdominal pain, vomiting, diarrhea  HPI: Patient is a 72 year old female with a history of hypertension, hyperlipidemia, diabetes who presents the emergency department with epigastric abdominal pain that started this evening and woke her from sleep. She describes it as an achy pain. It lasted for several minutes and has completely resolved upon my evaluation. She's had nausea, vomiting and diarrhea.  Some radiation of pain into chest. No shortness of breath. No prior similar episodes in the past. Denies any aggravating or alleviating factors. No radiation. No history of coronary artery disease. No prior abdominal surgeries. No sick contacts or recent travel. She denies any fevers or chills. No dysuria or hematuria. No bloody stools or melena.  ROS: See HPI Constitutional: no fever  Eyes: no drainage  ENT: no runny nose   Cardiovascular:  chest pain  Resp: no SOB  GI:  vomiting GU: no dysuria Integumentary: no rash  Allergy: no hives  Musculoskeletal: no leg swelling  Neurological: no slurred speech ROS otherwise negative  PAST MEDICAL HISTORY/PAST SURGICAL HISTORY:  Past Medical History  Diagnosis Date  . Type 2 diabetes mellitus   . Hypercholesterolemia with hyperglyceridemia   . HTN (hypertension)   . Chest pain     adenosine myoview (10/10) showed EF 72% and ischemia in the apical anterior wall and true apex. LHC (11/10): EF 60%, LVEDP 11, separate ostia for LAD and CFX, mild luminal irregularities in the LAD, no obstructive disease    MEDICATIONS:  Prior to Admission medications   Medication Sig Start Date End Date Taking? Authorizing Provider  aspirin 81 MG tablet Take 81 mg by mouth daily.      Historical Provider, MD  b complex vitamins tablet Take 1 tablet by mouth daily.      Historical Provider, MD  bumetanide (BUMEX) 0.5 MG tablet Take 0.25 mg by mouth daily.      Historical Provider, MD  Calcium Carbonate-Vitamin D  (CALTRATE 600+D) 600-400 MG-UNIT per tablet Take 1 tablet by mouth daily.      Historical Provider, MD  Cholecalciferol (VITAMIN D3) 2000 UNITS TABS Take 1 capsule by mouth 4 (four) times daily.      Historical Provider, MD  enalapril (VASOTEC) 20 MG tablet Take 20 mg by mouth daily.      Historical Provider, MD  fenofibrate micronized (LOFIBRA) 134 MG capsule Take 134 mg by mouth daily.      Historical Provider, MD  glyBURIDE (DIABETA) 5 MG tablet Take 2.5 mg by mouth daily with breakfast.      Historical Provider, MD  loratadine (CLARITIN) 10 MG tablet Take 10 mg by mouth at bedtime.      Historical Provider, MD  metFORMIN (GLUCOPHAGE) 500 MG tablet Take 500 mg by mouth 4 (four) times daily - after meals and at bedtime.      Historical Provider, MD  Multiple Vitamin (MULTIVITAMIN) capsule Take 1 capsule by mouth daily.      Historical Provider, MD  Omega-3 Fatty Acids (FISH OIL) 1200 MG CAPS Take 1 capsule by mouth daily.      Historical Provider, MD  simvastatin (ZOCOR) 40 MG tablet Take 20 mg by mouth every Monday, Wednesday, and Friday.      Historical Provider, MD    ALLERGIES:  Allergies  Allergen Reactions  . Latex     SOCIAL HISTORY:  History  Substance Use Topics  . Smoking status: Former Games developer  . Smokeless tobacco: Not on file  .  Alcohol Use: Not on file    FAMILY HISTORY: Family History  Problem Relation Age of Onset  . Coronary artery disease      No premature    EXAM: There were no vitals taken for this visit. CONSTITUTIONAL: Alert and oriented and responds appropriately to questions. Well-appearing; well-nourished HEAD: Normocephalic EYES: Conjunctivae clear, PERRL ENT: normal nose; no rhinorrhea; moist mucous membranes; pharynx without lesions noted NECK: Supple, no meningismus, no LAD  CARD: RRR; S1 and S2 appreciated; no murmurs, no clicks, no rubs, no gallops RESP: Normal chest excursion without splinting or tachypnea; breath sounds clear and equal  bilaterally; no wheezes, no rhonchi, no rales,  ABD/GI: Normal bowel sounds; non-distended; soft, non-tender, no rebound, no guarding BACK:  The back appears normal and is non-tender to palpation, there is no CVA tenderness EXT: Normal ROM in all joints; non-tender to palpation; no edema; normal capillary refill; no cyanosis    SKIN: Normal color for age and race; warm NEURO: Moves all extremities equally PSYCH: The patient's mood and manner are appropriate. Grooming and personal hygiene are appropriate.  MEDICAL DECISION MAKING: Patient with epigastric abdominal pain, vomiting and diarrhea that started tonight at 9 PM. She is completely asymptomatic. She was only given 324 mg of aspirin with EMS. Patient has risk factors for CAD but her story is very atypical. Her abdominal exam is completely benign. She is hemodynamically stable. Her EKG is completely normal. Obtain one set of cardiac enzymes, abdominal labs, urinalysis. Will continue to observe in the ED. If workup is negative, anticipate discharge back to her facility.   Date: 07/29/2013 2:37 AM  Rate: 64  Rhythm: normal sinus rhythm  QRS Axis: normal  Intervals: normal  ST/T Wave abnormalities: normal  Conduction Disutrbances: none  Narrative Interpretation: unremarkable; no ischemic changes, normal EKG      ED PROGRESS: Labs show mild leukocytosis. Troponin at 6 hours after onset of the event is negative. Abdominal labs unremarkable. Urine pending. Chest x-ray clear. Will by mouth challenge.   6:16 AM  Patient able to tolerate by mouth without any difficulty. Urine pending.  6:49 AM  Urine shows many bacteria but no other sign of infection. Culture is pending. We'll discharge home with return precautions, PCP followup. Patient and daughter at bedside verbalize understanding and are comfortable with this plan.  Layla Maw Khalis Hittle, DO 07/29/13 [phone]

## 2013-07-30 LAB — URINE CULTURE
Colony Count: NO GROWTH
Culture: NO GROWTH

## 2013-08-04 ENCOUNTER — Ambulatory Visit (HOSPITAL_COMMUNITY)
Admission: RE | Admit: 2013-08-04 | Discharge: 2013-08-04 | Disposition: A | Discharge status: Home / Self Care | Payer: Medicare Other | Source: Ambulatory Visit | Attending: Internal Medicine | Admitting: Internal Medicine

## 2013-08-04 DIAGNOSIS — Z1231 Encounter for screening mammogram for malignant neoplasm of breast: Secondary | ICD-10-CM

## 2013-08-05 LAB — HM MAMMOGRAPHY

## 2013-10-06 ENCOUNTER — Other Ambulatory Visit: Payer: Self-pay | Admitting: Emergency Medicine

## 2013-10-19 ENCOUNTER — Encounter: Payer: Self-pay | Admitting: Physician Assistant

## 2013-10-19 ENCOUNTER — Ambulatory Visit: Payer: Self-pay | Admitting: Physician Assistant

## 2013-10-19 DIAGNOSIS — I10 Essential (primary) hypertension: Secondary | ICD-10-CM

## 2013-10-19 DIAGNOSIS — E119 Type 2 diabetes mellitus without complications: Secondary | ICD-10-CM | POA: Insufficient documentation

## 2013-10-20 ENCOUNTER — Encounter: Payer: Self-pay | Admitting: Physician Assistant

## 2013-10-20 ENCOUNTER — Ambulatory Visit: Payer: Medicare Other | Admitting: Physician Assistant

## 2013-10-20 VITALS — BP 128/70 | HR 68 | Temp 98.1°F | Resp 16 | Ht 61.0 in | Wt 161.0 lb

## 2013-10-20 DIAGNOSIS — F05 Delirium due to known physiological condition: Secondary | ICD-10-CM

## 2013-10-20 DIAGNOSIS — E538 Deficiency of other specified B group vitamins: Secondary | ICD-10-CM

## 2013-10-20 DIAGNOSIS — N390 Urinary tract infection, site not specified: Secondary | ICD-10-CM

## 2013-10-20 LAB — CBC WITH DIFFERENTIAL/PLATELET
Basophils Absolute: 0.1 10*3/uL (ref 0.0–0.1)
Basophils Relative: 1 % (ref 0–1)
Eosinophils Absolute: 0.1 10*3/uL (ref 0.0–0.7)
Hemoglobin: 13.1 g/dL (ref 12.0–15.0)
MCH: 31 pg (ref 26.0–34.0)
MCHC: 33.4 g/dL (ref 30.0–36.0)
Monocytes Absolute: 0.5 10*3/uL (ref 0.1–1.0)
Monocytes Relative: 7 % (ref 3–12)
Neutrophils Relative %: 68 % (ref 43–77)
RDW: 12.7 % (ref 11.5–15.5)

## 2013-10-20 LAB — BASIC METABOLIC PANEL WITH GFR
BUN: 18 mg/dL (ref 6–23)
Calcium: 9.4 mg/dL (ref 8.4–10.5)
GFR, Est African American: >89 mL/min
GFR, Est Non African American: 83 mL/min
Glucose, Bld: 259 mg/dL — ABNORMAL HIGH (ref 70–99)

## 2013-10-20 LAB — HEPATIC FUNCTION PANEL
ALT: 14 U/L (ref 0–35)
Albumin: 4.2 g/dL (ref 3.5–5.2)
Total Protein: 6.6 g/dL (ref 6.0–8.3)

## 2013-10-20 LAB — VITAMIN B12: Vitamin B-12: 523 pg/mL (ref 211–911)

## 2013-10-20 MED ORDER — CIPROFLOXACIN HCL 500 MG PO TABS: 500.0000 mg | ORAL_TABLET | Freq: Two times a day (BID) | ORAL | Status: AC

## 2013-10-20 NOTE — Progress Notes (Signed)
Subjective:    Patient ID: Amy [name], female    DOB: [birth year], 72 y.o.   MRN: [id number]  HPI Daughter is here with patient. Patient is at [facility]. Daughter states she has had increased confusion. Patient has dementia and it is difficult to do ROS due to this.   Current Outpatient Prescriptions on File Prior to Visit  Medication Sig Dispense Refill  . aspirin 81 MG tablet Take 81 mg by mouth daily.        Marland Kitchen b complex vitamins tablet Take 1 tablet by mouth daily.        . cetirizine (ZYRTEC) 10 MG tablet Take 10 mg by mouth daily as needed for allergies.      . Cholecalciferol 5000 UNITS TABS Take 2 tablets by mouth daily.      . citalopram (CELEXA) 20 MG tablet Take 20 mg by mouth daily.      Marland Kitchen donepezil (ARICEPT) 10 MG tablet Take 10 mg by mouth every evening. At 7pm      . enalapril (VASOTEC) 20 MG tablet Take 20 mg by mouth daily.        . fenofibrate micronized (LOFIBRA) 134 MG capsule Take 134 mg by mouth daily.        . Magnesium Oxide 250 MG TABS Take 1 tablet by mouth daily.      . Memantine HCl ER (NAMENDA XR) 28 MG CP24 Take 1 capsule by mouth every evening. At 5pm      . metFORMIN (GLUCOPHAGE-XR) 500 MG 24 hr tablet Take 500 mg by mouth daily with breakfast.      . OMEGA 3 1000 MG CAPS Take 1 capsule by mouth daily.      . VESICARE 5 MG tablet TAKE 1 TABLET BY MOUTH ONCE DAILY-BLADDER CONTROL-  30 tablet  3   No current facility-administered medications on file prior to visit.   Past Medical History  Diagnosis Date  . Type 2 diabetes mellitus   . Hypercholesterolemia with hyperglyceridemia   . HTN (hypertension)   . Chest pain     adenosine myoview (10/10) showed EF 72% and ischemia in the apical anterior wall and true apex. LHC (11/10): EF 60%, LVEDP 11, separate ostia for LAD and CFX, mild luminal irregularities in the LAD, no obstructive disease  . Anxiety   . Depression   . Dementia of the Alzheimer's type     Review of Systems  Constitutional:  Negative.   HENT: Negative.   Eyes: Negative.   Respiratory: Negative.   Psychiatric/Behavioral: Positive for confusion (worsening).       Objective:   Physical Exam  Constitutional: She appears well-developed and well-nourished.  HENT:  Head: Normocephalic.  Right Ear: External ear normal.  Left Ear: External ear normal.  Nose: Nose normal.  Mouth/Throat: Oropharynx is clear and moist.  Eyes: Conjunctivae and EOM are normal. Pupils are equal, round, and reactive to light.  Neck: Normal range of motion. Neck supple.  Cardiovascular: Normal rate, regular rhythm and normal heart sounds.   Pulmonary/Chest: Effort normal and breath sounds normal.  Abdominal: Soft. She exhibits no mass. There is tenderness (LLQ). There is no rebound and no guarding.  Musculoskeletal: Normal range of motion.  Lymphadenopathy:    She has no cervical adenopathy.  Neurological: She is alert. She has normal strength. She is disoriented. No cranial nerve deficit. Gait normal.       Assessment & Plan:  1. Acute confusional state - CBC with Differential -  BASIC METABOLIC PANEL WITH GFR - Hepatic function panel - Vitamin B12  2. UTI (urinary tract infection) Cipro 500 #14 sent to pharmacy, since going into long holiday weekend will start meds - Urinalysis, Routine w reflex microscopic - Urine culture  3. Other B-complex deficiencies - Vitamin B12

## 2013-10-20 NOTE — Patient Instructions (Signed)
Please give Cipro twice a day with food for 3 days.  Urinary Tract Infection A urinary tract infection (UTI) can occur any place along the urinary tract. The tract includes the kidneys, ureters, bladder, and urethra. A type of germ called bacteria often causes a UTI. UTIs are often helped with antibiotic medicine.  HOME CARE   If given, take antibiotics as told by your doctor. Finish them even if you start to feel better.  Drink enough fluids to keep your pee (urine) clear or pale yellow.  Avoid tea, drinks with caffeine, and bubbly (carbonated) drinks.  Pee often. Avoid holding your pee in for a long time.  Pee before and after having sex (intercourse).  Wipe from front to back after you poop (bowel movement) if you are a woman. Use each tissue only once. GET HELP RIGHT AWAY IF:   You have back pain.  You have lower belly (abdominal) pain.  You have chills.  You feel sick to your stomach (nauseous).  You throw up (vomit).  Your burning or discomfort with peeing does not go away.  You have a fever.  Your symptoms are not better in 3 days. MAKE SURE YOU:   Understand these instructions.  Will watch your condition.  Will get help right away if you are not doing well or get worse. Document Released: 04/29/2008 Document Revised: 08/05/2012 Document Reviewed: 06/11/2012 [facility] Patient Information 2014 [place], [place].

## 2013-10-21 LAB — URINALYSIS, ROUTINE W REFLEX MICROSCOPIC
Glucose, UA: NEGATIVE mg/dL
Hgb urine dipstick: NEGATIVE
Leukocytes, UA: NEGATIVE
Nitrite: NEGATIVE
Specific Gravity, Urine: 1.014 (ref 1.005–1.030)
pH: 6 (ref 5.0–8.0)

## 2013-10-22 LAB — URINE CULTURE: Colony Count: >=100000

## 2013-11-03 ENCOUNTER — Ambulatory Visit (INDEPENDENT_AMBULATORY_CARE_PROVIDER_SITE_OTHER): Payer: Medicare Other | Admitting: Physician Assistant

## 2013-11-03 ENCOUNTER — Encounter: Payer: Self-pay | Admitting: Physician Assistant

## 2013-11-03 VITALS — BP 130/78 | HR 68 | Temp 97.7°F | Resp 16 | Wt 159.0 lb

## 2013-11-03 DIAGNOSIS — I10 Essential (primary) hypertension: Secondary | ICD-10-CM

## 2013-11-03 DIAGNOSIS — E785 Hyperlipidemia, unspecified: Secondary | ICD-10-CM

## 2013-11-03 DIAGNOSIS — N3000 Acute cystitis without hematuria: Secondary | ICD-10-CM

## 2013-11-03 DIAGNOSIS — E119 Type 2 diabetes mellitus without complications: Secondary | ICD-10-CM

## 2013-11-03 DIAGNOSIS — E782 Mixed hyperlipidemia: Secondary | ICD-10-CM

## 2013-11-03 LAB — CBC WITH DIFFERENTIAL/PLATELET
Basophils Relative: 1 % (ref 0–1)
HCT: 38.6 % (ref 36.0–46.0)
Hemoglobin: 13.6 g/dL (ref 12.0–15.0)
MCH: 30.8 pg (ref 26.0–34.0)
MCHC: 35.2 g/dL (ref 30.0–36.0)
MCV: 87.5 fL (ref 78.0–100.0)
Monocytes Absolute: 0.7 10*3/uL (ref 0.1–1.0)
Monocytes Relative: 9 % (ref 3–12)
Neutro Abs: 5 10*3/uL (ref 1.7–7.7)

## 2013-11-03 LAB — BASIC METABOLIC PANEL WITH GFR
BUN: 19 mg/dL (ref 6–23)
GFR, Est African American: >89 mL/min
Glucose, Bld: 93 mg/dL (ref 70–99)
Potassium: 4.3 mEq/L (ref 3.5–5.3)

## 2013-11-03 LAB — URINALYSIS, ROUTINE W REFLEX MICROSCOPIC
Hgb urine dipstick: NEGATIVE
Leukocytes, UA: NEGATIVE
Nitrite: NEGATIVE
Protein, ur: NEGATIVE mg/dL
Urobilinogen, UA: 0.2 mg/dL (ref 0.0–1.0)

## 2013-11-03 LAB — LIPID PANEL
LDL Cholesterol: 96 mg/dL (ref 0–99)
Triglycerides: 210 mg/dL — ABNORMAL HIGH (ref <150)

## 2013-11-03 LAB — HEPATIC FUNCTION PANEL
ALT: 15 U/L (ref 0–35)
Alkaline Phosphatase: 56 U/L (ref 39–117)
Indirect Bilirubin: 0.2 mg/dL (ref 0.0–0.9)
Total Protein: 7.1 g/dL (ref 6.0–8.3)

## 2013-11-03 LAB — HEMOGLOBIN A1C
Hgb A1c MFr Bld: 6.8 % — ABNORMAL HIGH (ref <5.7)
Mean Plasma Glucose: 148 mg/dL — ABNORMAL HIGH (ref <117)

## 2013-11-03 LAB — TSH: TSH: 2.429 u[IU]/mL (ref 0.350–4.500)

## 2013-11-03 MED ORDER — RIVASTIGMINE 4.6 MG/24HR TD PT24: 4.6000 mg | MEDICATED_PATCH | Freq: Every day | TRANSDERMAL | Status: DC

## 2013-11-03 NOTE — Progress Notes (Signed)
HPI Patient presents for 3 month follow up with hypertension, hyperlipidemia, prediabetes and vitamin D. Patient's blood pressure has been controlled at home. Patient denies chest pain, shortness of breath, dizziness.  Patient's cholesterol is diet controlled.  The patient has been working on diet and exercise for prediabetes, and denies polys, and paresthesias.   Patient is on Vitamin D supplement.   She was here on Nov 26th for increasing confusion and she was given cipro and all of her labs were normal including her urine. She still finished her Cipro and daughter states she may be doing better. Daughter states today she was able to dress appropriately with the weather and feels she is doing better. However, she states that she has had some dizziness, no falls, and her daughter states that her vision has been blurring on her and she recently had an eye examination with new glasses 2-5 months ago. Denies incontinence.  Current Medications:  Current Outpatient Prescriptions on File Prior to Visit  Medication Sig Dispense Refill  . aspirin 81 MG tablet Take 81 mg by mouth daily.        Marland Kitchen b complex vitamins tablet Take 1 tablet by mouth daily.        . cetirizine (ZYRTEC) 10 MG tablet Take 10 mg by mouth daily as needed for allergies.      . Cholecalciferol 5000 UNITS TABS Take 2 tablets by mouth daily.      . citalopram (CELEXA) 20 MG tablet Take 20 mg by mouth daily.      Marland Kitchen donepezil (ARICEPT) 10 MG tablet Take 10 mg by mouth every evening. At 7pm      . enalapril (VASOTEC) 20 MG tablet Take 20 mg by mouth daily.        . fenofibrate micronized (LOFIBRA) 134 MG capsule Take 134 mg by mouth daily.        . Magnesium Oxide 250 MG TABS Take 1 tablet by mouth daily.      . Memantine HCl ER (NAMENDA XR) 28 MG CP24 Take 1 capsule by mouth every evening. At 5pm      . metFORMIN (GLUCOPHAGE-XR) 500 MG 24 hr tablet Take 500 mg by mouth daily with breakfast.      . OMEGA 3 1000 MG CAPS Take 1 capsule by  mouth daily.      . VESICARE 5 MG tablet TAKE 1 TABLET BY MOUTH ONCE DAILY-BLADDER CONTROL-  30 tablet  3   No current facility-administered medications on file prior to visit.   Medical History:  Past Medical History  Diagnosis Date  . Type 2 diabetes mellitus   . Hypercholesterolemia with hyperglyceridemia   . HTN (hypertension)   . Chest pain     adenosine myoview (10/10) showed EF 72% and ischemia in the apical anterior wall and true apex. LHC (11/10): EF 60%, LVEDP 11, separate ostia for LAD and CFX, mild luminal irregularities in the LAD, no obstructive disease  . Anxiety   . Depression   . Dementia of the Alzheimer's type    Allergies:  Allergies  Allergen Reactions  . Latex   . Lipitor [Atorvastatin]   . Shellfish-Derived Products     ROS Constitutional: Denies fever, chills, weight loss/gain, headaches, insomnia, fatigue, night sweats, and change in appetite. Eyes: Denies redness, blurred vision, diplopia, discharge, itchy, watery eyes.  ENT: Denies discharge, congestion, post nasal drip, sore throat, earache, dental pain, Tinnitus, Vertigo, Sinus pain, snoring.  Cardio: Denies chest pain, palpitations, irregular heartbeat,  dyspnea, diaphoresis, orthopnea, PND, claudication, edema Respiratory: denies cough, dyspnea,pleurisy, hoarseness, wheezing.  Gastrointestinal: Denies dysphagia, heartburn,  water brash, pain, cramps, nausea, vomiting, bloating, diarrhea, constipation, hematemesis, melena, hematochezia,  hemorrhoids Genitourinary: Denies dysuria, frequency, urgency, nocturia, hesitancy, discharge, hematuria, flank pain Musculoskeletal: Denies arthralgia, myalgia, stiffness, Jt. Swelling, pain, limp, and strain/sprain. Skin: Denies pruritis, rash, hives, warts, acne, eczema, changing in skin lesion Neuro: Denies Weakness, tremor, incoordination, spasms, paresthesia, pain Psychiatric: + confusion, memory loss Endocrine: Denies change in weight, skin, hair change,  nocturia, and paresthesia, Diabetic Polys, Denies visual blurring, hyper /hypo glycemic episodes.  Heme/Lymph: Denies Excessive bleeding, bruising, enlarged lymph nodes  Family history- Review and unchanged Social history- Review and unchanged Physical Exam: Filed Vitals:   11/03/13 0859  BP: 130/78  Pulse: 68  Temp: 97.7 F (36.5 C)  Resp: 16   Filed Weights   11/03/13 0859  Weight: 159 lb (72.122 kg)   General Appearance: Well nourished, in no apparent distress. Eyes: PERRLA, EOMs, conjunctiva no swelling or erythema, normal fundi and vessels. Sinuses: No Frontal/maxillary tenderness ENT/Mouth: Ext aud canals clear, with TMs without erythema, bulging.No erythema, swelling, or exudate on post pharynx.  Tonsils not swollen or erythematous. Hearing normal.  Neck: Supple, thyroid normal.  Respiratory: Respiratory effort normal, BS equal bilaterally without rales, rhonchi, wheezing or stridor.  Cardio: Heart sounds normal, regular rate and rhythm without murmurs, rubs or gallops. Peripheral pulses brisk and equal bilaterally, without edema.  Abdomen: Soft, with bowel sounds. Non tender, no guarding, rebound, hernias, masses, or organomegaly.  Lymphatics: Non tender without lymphadenopathy.  Musculoskeletal: Full ROM all peripheral extremities, joint stability, 5/5 strength, and normal gait. Skin: Warm, dry without rashes, lesions, ecchymosis.  Neuro: Cranial nerves intact, reflexes equal bilaterally. Normal muscle tone, no cerebellar symptoms.  Psych: Awake and oriented X 1, normal affect  Assessment and Plan:  Hypertension: Continue medication, monitor blood pressure at home. Continue DASH diet. Cholesterol: Continue diet and exercise. Check cholesterol.  Pre-diabetes-Continue diet and exercise. Check A1C Vitamin D Def-  continue medications.  Dementia- recheck urine, start exelon patch and continue aricept, consider MRI/MRA but daughter wishes to wait at this time.  Quentin Mulling 9:50 AM

## 2013-11-03 NOTE — Patient Instructions (Addendum)
Start the Exelon patch Dementia Dementia is a general term for problems with brain function. A person with dementia has memory loss and a hard time with at least one other brain function such as thinking, speaking, or problem solving. Dementia can affect social functioning, how you do your job, your mood, or your personality. The changes may be hidden for a long time. The earliest forms of this disease are usually not detected by family or friends. Dementia can be:  Irreversible.  Potentially reversible.  Partially reversible.  Progressive. This means it can get worse over time. CAUSES  Irreversible dementia causes may include:  Degeneration of brain cells (Alzheimer's disease or lewy body dementia).  Multiple small strokes (vascular dementia).  Infection (chronic meningitis or Creutzfelt-Jakob disease).  Frontotemporal dementia. This affects younger people, age 59 to 45, compared to those who have Alzheimer's disease.  Dementia associated with other disorders like Parkinson's disease, Huntington's disease, or HIV-associated dementia. Potentially or partially reversible dementia causes may include:  Medicines.  Metabolic causes such as excessive alcohol intake, vitamin B12 deficiency, or thyroid disease.  Masses or pressure in the brain such as a tumor, blood clot, or hydrocephalus. SYMPTOMS  Symptoms are often hard to detect. Family members or coworkers may not notice them early in the disease process. Different people with dementia may have different symptoms. Symptoms can include:  A hard time with memory, especially recent memory. Long-term memory may not be impaired.  Asking the same question multiple times or forgetting something someone just said.  A hard time speaking your thoughts or finding certain words.  A hard time solving problems or performing familiar tasks (such as how to use a telephone).  Sudden changes in mood.  Changes in personality, especially  increasing moodiness or mistrust.  Depression.  A hard time understanding complex ideas that were never a problem in the past. DIAGNOSIS  There are no specific tests for dementia.   Your caregiver may recommend a thorough evaluation. This is because some forms of dementia can be reversible. The evaluation will likely include a physical exam and getting a detailed history from you and a family member. The history often gives the best clues and suggestions for a diagnosis.  Memory testing may be done. A detailed brain function evaluation called neuropsychologic testing may be helpful.  Lab tests and brain imaging (such as a CT scan or MRI scan) are sometimes important.  Sometimes observation and re-evaluation over time is very helpful. TREATMENT  Treatment depends on the cause.   If the problem is a vitamin deficiency, it may be helped or cured with supplements.  For dementias such as Alzheimer's disease, medicines are available to stabilize or slow the course of the disease. There are no cures for this type of dementia.  Your caregiver can help direct you to groups, organizations, and other caregivers to help with decisions in the care of you or your loved one. HOME CARE INSTRUCTIONS The care of individuals with dementia is varied and dependent upon the progression of the dementia. The following suggestions are intended for the person living with, or caring for, the person with dementia.  Create a safe environment.  Remove the locks on bathroom doors to prevent the person from accidentally locking himself or herself in.  Use childproof latches on kitchen cabinets and any place where cleaning supplies, chemicals, or alcohol are kept.  Use childproof covers in unused electrical outlets.  Install childproof devices to keep doors and windows secured.  Remove stove  knobs or install safety knobs and an automatic shut-off on the stove.  Lower the temperature on water heaters.  Label  medicines and keep them locked up.  Secure knives, lighters, matches, power tools, and guns, and keep these items out of reach.  Keep the house free from clutter. Remove rugs or anything that might contribute to a fall.  Remove objects that might break and hurt the person.  Make sure lighting is good, both inside and outside.  Install grab rails as needed.  Use a monitoring device to alert you to falls or other needs for help.  Reduce confusion.  Keep familiar objects and people around.  Use night lights or dim lights at night.  Label items or areas.  Use reminders, notes, or directions for daily activities or tasks.  Keep a simple, consistent routine for waking, meals, bathing, dressing, and bedtime.  Create a calm, quiet environment.  Place large clocks and calendars prominently.  Display emergency numbers and home address near all telephones.  Use cues to establish different times of the day. An example is to open curtains to let the natural light in during the day.   Use effective communication.  Choose simple words and short sentences.  Use a gentle, calm tone of voice.  Be careful not to interrupt.  If the person is struggling to find a word or communicate a thought, try to provide the word or thought.  Ask one question at a time. Allow the person ample time to answer questions. Repeat the question again if the person does not respond.  Reduce nighttime restlessness.  Provide a comfortable bed.  Have a consistent nighttime routine.  Ensure a regular walking or physical activity schedule. Involve the person in daily activities as much as possible.  Limit napping during the day.  Limit caffeine.  Attend social events that stimulate rather than overwhelm the senses.  Encourage good nutrition and hydration.  Reduce distractions during meal times and snacks.  Avoid foods that are too hot or too cold.  Monitor chewing and swallowing  ability.  Continue with routine vision, hearing, dental, and medical screenings.  Only give over-the-counter or prescription medicines as directed by the caregiver.  Monitor driving abilities. Do not allow the person to drive when safe driving is no longer possible.  Register with an identification program which could provide location assistance in the event of a missing person situation. SEEK MEDICAL CARE IF:   New behavioral problems start such as moodiness, aggressiveness, or seeing things that are not there (hallucinations).  Any new problem with brain function happens. This includes problems with balance, speech, or falling a lot.  Problems with swallowing develop.  Any symptoms of other illness happen. Small changes or worsening in any aspect of brain function can be a sign that the illness is getting worse. It can also be a sign of another medical illness such as infection. Seeing a caregiver right away is important. SEEK IMMEDIATE MEDICAL CARE IF:   A fever develops.  New or worsened confusion develops.  New or worsened sleepiness develops.  Staying awake becomes hard to do. Document Released: 05/07/2001 Document Revised: 02/03/2012 Document Reviewed: 04/08/2011 [facility] Patient Information 2014 [place], [place].

## 2013-11-04 LAB — INSULIN, FASTING: Insulin fasting, serum: 22 u[IU]/mL (ref 3–28)

## 2013-11-05 LAB — URINE CULTURE

## 2014-01-25 ENCOUNTER — Encounter: Payer: Self-pay | Admitting: Internal Medicine

## 2014-02-02 ENCOUNTER — Other Ambulatory Visit: Payer: Self-pay | Admitting: *Deleted

## 2014-02-02 ENCOUNTER — Other Ambulatory Visit: Payer: Self-pay | Admitting: Physician Assistant

## 2014-02-02 MED ORDER — METFORMIN HCL ER 500 MG PO TB24: 500.0000 mg | ORAL_TABLET | Freq: Every day | ORAL | Status: DC

## 2014-02-02 MED ORDER — CITALOPRAM HYDROBROMIDE 20 MG PO TABS: 20.0000 mg | ORAL_TABLET | Freq: Every day | ORAL | Status: DC

## 2014-02-02 MED ORDER — FENOFIBRATE MICRONIZED 134 MG PO CAPS: 134.0000 mg | ORAL_CAPSULE | Freq: Every day | ORAL | Status: DC

## 2014-02-02 MED ORDER — SOLIFENACIN SUCCINATE 5 MG PO TABS: ORAL_TABLET | ORAL | Status: DC

## 2014-02-02 MED ORDER — DONEPEZIL HCL 10 MG PO TABS: 10.0000 mg | ORAL_TABLET | Freq: Every evening | ORAL | Status: DC

## 2014-02-02 MED ORDER — ENALAPRIL MALEATE 20 MG PO TABS: 20.0000 mg | ORAL_TABLET | Freq: Every day | ORAL | Status: DC

## 2014-02-23 ENCOUNTER — Other Ambulatory Visit: Payer: Self-pay | Admitting: *Deleted

## 2014-03-16 ENCOUNTER — Ambulatory Visit (INDEPENDENT_AMBULATORY_CARE_PROVIDER_SITE_OTHER): Payer: Medicare Other | Admitting: Internal Medicine

## 2014-03-16 ENCOUNTER — Encounter: Payer: Self-pay | Admitting: Internal Medicine

## 2014-03-16 VITALS — BP 132/76 | HR 64 | Temp 97.9°F | Resp 16 | Ht 62.25 in | Wt 157.8 lb

## 2014-03-16 DIAGNOSIS — E1129 Type 2 diabetes mellitus with other diabetic kidney complication: Secondary | ICD-10-CM

## 2014-03-16 DIAGNOSIS — Z789 Other specified health status: Secondary | ICD-10-CM

## 2014-03-16 DIAGNOSIS — G301 Alzheimer's disease with late onset: Secondary | ICD-10-CM

## 2014-03-16 DIAGNOSIS — E782 Mixed hyperlipidemia: Secondary | ICD-10-CM

## 2014-03-16 DIAGNOSIS — F0280 Dementia in other diseases classified elsewhere without behavioral disturbance: Secondary | ICD-10-CM | POA: Insufficient documentation

## 2014-03-16 DIAGNOSIS — I10 Essential (primary) hypertension: Secondary | ICD-10-CM

## 2014-03-16 DIAGNOSIS — Z0000 Encounter for general adult medical examination without abnormal findings: Secondary | ICD-10-CM

## 2014-03-16 DIAGNOSIS — Z79899 Other long term (current) drug therapy: Secondary | ICD-10-CM | POA: Insufficient documentation

## 2014-03-16 DIAGNOSIS — Z1212 Encounter for screening for malignant neoplasm of rectum: Secondary | ICD-10-CM

## 2014-03-16 DIAGNOSIS — Z1331 Encounter for screening for depression: Secondary | ICD-10-CM

## 2014-03-16 DIAGNOSIS — E559 Vitamin D deficiency, unspecified: Secondary | ICD-10-CM

## 2014-03-16 LAB — CBC WITH DIFFERENTIAL/PLATELET
Basophils Absolute: 0.1 10*3/uL (ref 0.0–0.1)
Basophils Relative: 1 % (ref 0–1)
EOS ABS: 0.1 10*3/uL (ref 0.0–0.7)
Eosinophils Relative: 2 % (ref 0–5)
HCT: 39.2 % (ref 36.0–46.0)
Hemoglobin: 13.6 g/dL (ref 12.0–15.0)
LYMPHS ABS: 1.9 10*3/uL (ref 0.7–4.0)
LYMPHS PCT: 25 % (ref 12–46)
MCH: 30.7 pg (ref 26.0–34.0)
MCHC: 34.7 g/dL (ref 30.0–36.0)
MCV: 88.5 fL (ref 78.0–100.0)
Monocytes Absolute: 0.6 10*3/uL (ref 0.1–1.0)
Monocytes Relative: 8 % (ref 3–12)
NEUTROS PCT: 64 % (ref 43–77)
Neutro Abs: 4.7 10*3/uL (ref 1.7–7.7)
PLATELETS: 369 10*3/uL (ref 150–400)
RBC: 4.43 MIL/uL (ref 3.87–5.11)
RDW: 13.1 % (ref 11.5–15.5)
WBC: 7.4 10*3/uL (ref 4.0–10.5)

## 2014-03-16 LAB — LIPID PANEL
CHOL/HDL RATIO: 4.1 ratio
Cholesterol: 187 mg/dL (ref 0–200)
HDL: 46 mg/dL (ref >39)
LDL Cholesterol: 90 mg/dL (ref 0–99)
TRIGLYCERIDES: 257 mg/dL — AB (ref <150)
VLDL: 51 mg/dL — ABNORMAL HIGH (ref 0–40)

## 2014-03-16 LAB — TSH: TSH: 1.91 u[IU]/mL (ref 0.350–4.500)

## 2014-03-16 LAB — BASIC METABOLIC PANEL WITH GFR
BUN: 17 mg/dL (ref 6–23)
CALCIUM: 9.6 mg/dL (ref 8.4–10.5)
CHLORIDE: 101 meq/L (ref 96–112)
CO2: 28 mEq/L (ref 19–32)
Creat: 0.67 mg/dL (ref 0.50–1.10)
GFR, Est African American: >89 mL/min
GFR, Est Non African American: 89 mL/min
Glucose, Bld: 132 mg/dL — ABNORMAL HIGH (ref 70–99)
Potassium: 4.4 mEq/L (ref 3.5–5.3)
SODIUM: 138 meq/L (ref 135–145)

## 2014-03-16 LAB — HEPATIC FUNCTION PANEL
ALT: 16 U/L (ref 0–35)
AST: 16 U/L (ref 0–37)
Albumin: 4.3 g/dL (ref 3.5–5.2)
Alkaline Phosphatase: 51 U/L (ref 39–117)
BILIRUBIN DIRECT: 0.1 mg/dL (ref 0.0–0.3)
BILIRUBIN TOTAL: 0.3 mg/dL (ref 0.2–1.2)
Indirect Bilirubin: 0.2 mg/dL (ref 0.2–1.2)
Total Protein: 6.8 g/dL (ref 6.0–8.3)

## 2014-03-16 LAB — HEMOGLOBIN A1C
Hgb A1c MFr Bld: 7.1 % — ABNORMAL HIGH (ref <5.7)
MEAN PLASMA GLUCOSE: 157 mg/dL — AB (ref <117)

## 2014-03-16 LAB — MAGNESIUM: MAGNESIUM: 1.7 mg/dL (ref 1.5–2.5)

## 2014-03-16 NOTE — Patient Instructions (Signed)

## 2014-03-16 NOTE — Progress Notes (Signed)
Patient ID: Amy [name], female   DOB: 23-Jun-1941, 73 y.o.   MRN: [id number]    Annual Comprehensive Examination  [facility] 73 [facility].  Patient has been followed for HTN, Diabetes  Prediabetes, Hyperlipidemia, and Vitamin D Deficiency. Patient has been follow several years with progressive mental decline and has been evaluated by neurologist Dr Anne HahnWillis in the past with neg Brain MRI and she is treated with triple therapy (Donepezil, Memantine, Rivastigmine)  for memory. [facility] and has daytime sitters with daughters helping over the Weekends.  Daughter present today reports her mothers ST memory continues to decline. Patient needs moderation supervision and assistance with ADL's. No major problems with incontinance    HTN predates since 1992. Patient's BP has been controlled at home. Today's BP: 132/76 mmHg. Patient denies any cardiac symptoms as chest pain, palpitations, shortness of breath, dizziness or ankle swelling.   Patient's hyperlipidemia is controlled with diet and medications. Patient denies myalgias or other medication SE's. Last cholesterol last visit was190, triglycerides 210, HDL 52 and LDL 96 in Dec 2014 - at goal.     Patient has T2 NIDDM since 1983 w/Stage 1 CKD  with last A1c 6.8% in Dec 2014. Patient denies reactive hypoglycemic symptoms, visual blurring, diabetic polys, or paresthesias.   Finally, patient has history of Vitamin D Deficiency  Of 6631 in Oct 2008 with last vitamin D 86 in Sept 2014.   Medication Sig  . aspirin 81 MG tablet Take 81 mg by mouth daily.    . cetirizine (ZYRTEC) 10 MG tablet Take 10 mg by mouth daily as needed for allergies.  . Cholecalciferol 5000 UNITS TABS Take 2 tablets by mouth daily.  . citalopram (CELEXA) 20 MG tablet Take 1 tablet (20 mg total) by mouth daily.  Marland Kitchen. donepezil (ARICEPT) 10 MG tablet Take 1 tablet (10 mg total) by mouth every evening. At 7pm  . enalapril (VASOTEC) 20 MG  tablet Take 1 tablet (20 mg total) by mouth daily.  . fenofibrate micronized (LOFIBRA) 134 MG  Take 1 capsule (134 mg total) by mouth daily.  . Magnesium Oxide 250 MG TABS Take 1 tablet by mouth daily.  . Memantine HCl ER (NAMENDA XR) 28 MG Take 1 capsule by mouth every evening. At 5pm  . metFORMIN (GLUCOPHAGE-XR) 500 MG  Take 1 tablet (500 mg total) by mouth daily with breakfast.  . OMEGA 3 1000 MG CAPS Take 1 capsule by mouth daily.  . rivastigmine (EXELON) 4.6 mg/24hr Place 1 patch (4.6 mg total) onto the skin daily.  . solifenacin (VESICARE) 5 MG tablet TAKE 1 TABLET BY MOUTH ONCE DAILY-BLADDER CONTROL-   Allergies  Allergen Reactions  . Latex   . Lipitor [Atorvastatin]   . Shellfish-Derived Products    Past Medical History  Diagnosis Date  . Type 2 diabetes mellitus   . Hypercholesterolemia with hyperglyceridemia   . HTN (hypertension)   . Chest pain     adenosine myoview (10/10) showed EF 72% and ischemia in the apical anterior wall and true apex. LHC (11/10): EF 60%, LVEDP 11, separate ostia for LAD and CFX, mild luminal irregularities in the LAD, no obstructive disease  . Anxiety   . Depression   . Dementia of the Alzheimer's type     Past Surgical History  Procedure Laterality Date  . Colonoscopy      07/02/2010  . Cardiac catheterization  2010    negative  . Tubal ligation  Family History  Problem Relation Age of Onset  . Coronary artery disease      No premature  . Cancer Mother [id number]    breast  . Hypertension Father   . Stroke Father   . Hypertension Brother   . Heart disease Brother   . Cancer Sister     breast   History  Substance Use Topics  . Smoking status: Former Smoker -- 5 years    Quit date: 10/20/1987  . Smokeless tobacco: Never Used  . Alcohol Use: No    ROS Constitutional: Denies fever, chills, weight loss/gain, headaches, insomnia, fatigue, night sweats, and change in appetite. Eyes: Denies redness, blurred vision, diplopia, discharge,  itchy, watery eyes.  ENT: Denies discharge, congestion, post nasal drip, epistaxis, sore throat, earache, hearing loss, dental pain, Tinnitus, Vertigo, Sinus pain, snoring.  Cardio: Denies chest pain, palpitations, irregular heartbeat, syncope, dyspnea, diaphoresis, orthopnea, PND, claudication, edema Respiratory: denies cough, dyspnea, DOE, pleurisy, hoarseness, laryngitis, wheezing.  Gastrointestinal: Denies dysphagia, heartburn, reflux, water brash, pain, cramps, nausea, vomiting, bloating, diarrhea, constipation, hematemesis, melena, hematochezia, jaundice, hemorrhoids Genitourinary: Denies dysuria, frequency, urgency, nocturia, hesitancy, discharge, hematuria, flank pain Breast:Breast lumps, nipple discharge, bleeding.  Musculoskeletal: Denies arthralgia, myalgia, stiffness, Jt. Swelling, pain, limp, and strain/sprain. Skin: Denies puritis, rash, hives, warts, acne, eczema, changing in skin lesion Neuro: No weakness, tremor, incoordination, spasms, paresthesia, pain Psychiatric: Problens with short term memory loss and anomia. Endocrine: Denies change in weight, skin, hair change, nocturia, and paresthesia, diabetic polys, visual blurring, hyper / hypo glycemic episodes.  Heme/Lymph: No excessive bleeding, bruising, enlarged lymph nodes.   Physical Exam BP 132/76  Pulse 64  Temp 97.9 F   Resp 16  Ht 5' 2.25"   Wt 157 lb 12.8 oz   BMI 28.64 kg/m2   General Appearance: Well nourished, in no apparent distress. Eyes: PERRLA, EOMs, conjunctiva no swelling or erythema, normal fundi and vessels. Sinuses: No frontal/maxillary tenderness ENT/Mouth: EACs patent / TMs  nl. Nares clear without erythema, swelling, mucoid exudates. Oral hygiene is good. No erythema, swelling, or exudate. Tongue normal, non-obstructing. Tonsils not swollen or erythematous. Hearing normal.  Neck: Supple, thyroid normal. No bruits, nodes or JVD. Respiratory: Respiratory effort normal.  BS equal and clear bilateral  without rales, rhonci, wheezing or stridor. Cardio: Heart sounds are normal with regular rate and rhythm and no murmurs, rubs or gallops. Peripheral pulses are normal and equal bilaterally without edema. No aortic or femoral bruits. Chest: symmetric with normal excursions and percussion. Breasts: Symmetric, without lumps, nipple discharge, retractions, or fibrocystic changes.  Abdomen: Flat, soft, with bowl sounds. Nontender, no guarding, rebound, hernias, masses, or organomegaly.  Lymphatics: Non tender without lymphadenopathy.  Genitourinary:  Musculoskeletal: Full ROM all peripheral extremities, joint stability, 5/5 strength, and normal gait. Skin: Warm and dry without rashes, lesions, cyanosis, clubbing or  ecchymosis.  Neuro: Cranial nerves intact, reflexes equal bilaterally. Normal muscle tone, no cerebellar symptoms. Sensation intact.  Pysch: Awake and oriented X 3, normal affect, Insight and Judgment appropriate.   Assessment and Plan  1. Annual Screening Examination 2. Hypertension 3. Hyperlipidemia 4. Pre Diabetes 5. Vitamin D Deficiency 6. SDAT  Continue prudent diet as discussed, weight control, BP monitoring, regular exercise, and medications. Discussed med's effects and SE's. Screening labs and tests as requested with regular follow-up as recommended.

## 2014-03-17 LAB — MICROALBUMIN / CREATININE URINE RATIO
CREATININE, URINE: 284.1 mg/dL
Microalb Creat Ratio: 8.6 mg/g (ref 0.0–30.0)
Microalb, Ur: 2.44 mg/dL — ABNORMAL HIGH (ref 0.00–1.89)

## 2014-03-17 LAB — VITAMIN D 25 HYDROXY (VIT D DEFICIENCY, FRACTURES): Vit D, 25-Hydroxy: 91 ng/mL — ABNORMAL HIGH (ref 30–89)

## 2014-03-17 LAB — INSULIN, FASTING: Insulin fasting, serum: 104 u[IU]/mL — ABNORMAL HIGH (ref 3–28)

## 2014-03-21 ENCOUNTER — Other Ambulatory Visit: Payer: Self-pay | Admitting: *Deleted

## 2014-03-21 MED ORDER — MEMANTINE HCL ER 28 MG PO CP24: 1.0000 | ORAL_CAPSULE | Freq: Every evening | ORAL | Status: DC

## 2014-04-05 ENCOUNTER — Other Ambulatory Visit: Payer: Self-pay

## 2014-04-05 MED ORDER — METFORMIN HCL ER 500 MG PO TB24: 500.0000 mg | ORAL_TABLET | Freq: Every day | ORAL | Status: DC

## 2014-04-11 ENCOUNTER — Other Ambulatory Visit (INDEPENDENT_AMBULATORY_CARE_PROVIDER_SITE_OTHER): Payer: Medicare Other

## 2014-04-11 DIAGNOSIS — Z1212 Encounter for screening for malignant neoplasm of rectum: Secondary | ICD-10-CM

## 2014-04-11 LAB — POC HEMOCCULT BLD/STL (HOME/3-CARD/SCREEN)
Card #2 Fecal Occult Blod, POC: NEGATIVE
Card #3 Fecal Occult Blood, POC: NEGATIVE
FECAL OCCULT BLD: NEGATIVE

## 2014-06-02 ENCOUNTER — Ambulatory Visit (INDEPENDENT_AMBULATORY_CARE_PROVIDER_SITE_OTHER): Payer: Medicare Other | Admitting: Internal Medicine

## 2014-06-02 ENCOUNTER — Encounter: Payer: Self-pay | Admitting: Internal Medicine

## 2014-06-02 VITALS — BP 116/60 | HR 72 | Temp 98.1°F | Resp 16 | Ht 61.75 in | Wt 157.4 lb

## 2014-06-02 DIAGNOSIS — R300 Dysuria: Secondary | ICD-10-CM

## 2014-06-02 MED ORDER — CIPROFLOXACIN HCL 250 MG PO TABS: 250.0000 mg | ORAL_TABLET | Freq: Two times a day (BID) | ORAL | Status: AC

## 2014-06-02 NOTE — Progress Notes (Signed)
Subjective:    Patient ID: Amy [name], female    DOB: 1940/12/17, 73 y.o.   MRN: [id number]  Urinary Tract Infection  This is a new problem. The current episode started in the past 7 days. The problem occurs intermittently. The problem has been gradually worsening. The quality of the pain is described as aching and burning. The pain is mild. There has been no fever. She is not sexually active. There is no history of pyelonephritis. Associated symptoms include chills, frequency and urgency. Pertinent negatives include no discharge, flank pain, hematuria, hesitancy, nausea, sweats or vomiting. She has tried nothing for the symptoms.     Medication List       This list is accurate as of: 06/02/14  1:11 PM.  Always use your most recent med list.               aspirin 81 MG tablet  Take 81 mg by mouth daily.     BAYER CONTOUR TEST test strip  Generic drug:  glucose blood     cetirizine 10 MG tablet  Commonly known as:  ZYRTEC  Take 10 mg by mouth daily as needed for allergies.     Cholecalciferol 5000 UNITS Tabs  Take 2 tablets by mouth daily.     ciprofloxacin 250 MG tablet  Commonly known as:  CIPRO  Take 1 tablet (250 mg total) by mouth 2 (two) times daily. For UTI     citalopram 20 MG tablet  Commonly known as:  CELEXA  Take 1 tablet (20 mg total) by mouth daily.     donepezil 10 MG tablet  Commonly known as:  ARICEPT  Take 1 tablet (10 mg total) by mouth every evening. At 7pm     enalapril 20 MG tablet  Commonly known as:  VASOTEC  Take 1 tablet (20 mg total) by mouth daily.     fenofibrate micronized 134 MG capsule  Commonly known as:  LOFIBRA  Take 1 capsule (134 mg total) by mouth daily.     Magnesium Oxide 250 MG Tabs  Take 1 tablet by mouth daily.     Memantine HCl ER 28 MG Cp24  Commonly known as:  NAMENDA XR  Take 28 mg by mouth every evening. At 5pm     metFORMIN 500 MG 24 hr tablet  Commonly known as:  GLUCOPHAGE-XR  Take 1 tablet (500 mg  total) by mouth daily with breakfast.     Omega 3 1000 MG Caps  Take 1 capsule by mouth daily.     rivastigmine 4.6 mg/24hr  Commonly known as:  EXELON  Place 1 patch (4.6 mg total) onto the skin daily.     solifenacin 5 MG tablet  Commonly known as:  VESICARE  TAKE 1 TABLET BY MOUTH ONCE DAILY-BLADDER CONTROL-       Allergies  Allergen Reactions  . Latex   . Lipitor [Atorvastatin]   . Shellfish-Derived Products    Past Medical History  Diagnosis Date  . Type 2 diabetes mellitus   . Hypercholesterolemia with hyperglyceridemia   . HTN (hypertension)   . Chest pain     adenosine myoview (10/10) showed EF 72% and ischemia in the apical anterior wall and true apex. LHC (11/10): EF 60%, LVEDP 11, separate ostia for LAD and CFX, mild luminal irregularities in the LAD, no obstructive disease  . Anxiety   . Depression   . Dementia of the Alzheimer's type    Review of Systems  Constitutional: Positive for chills and activity change. Negative for fever, diaphoresis and appetite change.  HENT: Negative.   Eyes: Negative.   Respiratory: Negative.   Cardiovascular: Negative.   Gastrointestinal: Negative for nausea and vomiting.  Genitourinary: Positive for dysuria, urgency, frequency, decreased urine volume and enuresis. Negative for hesitancy, hematuria, flank pain and difficulty urinating.  Neurological: Negative.    Objective:   Physical Exam BP 116/60  Pulse 72  Temp(Src) 98.1 F (36.7 C) (Temporal)  Resp 16  Ht 5' 1.75" (1.568 m)  Wt 157 lb 6.4 oz (71.396 kg)  BMI 29.04 kg/m2  HEENT - Eac's patent. TM's Nl. EOM's full. PERRLA. NasoOroPharynx clear. Neck - supple. Nl Thyroid. Carotids 2+ & No bruits, nodes, JVD Chest - Clear equal BS w/o Rales, rhonchi, wheezes. Cor - Nl HS. RRR w/o sig MGR. PP 1(+). No edema. Abd - Soft w/o  Masses, but there is sl suprapubic tenderness. BS nl. MS- FROM w/o deformities. Muscle power, tone and bulk Nl. Gait Nl. Neuro - No obvious Cr N  abnormalities. Sensory, motor and Cerebellar functions appear Nl w/o focal abnormalities. Psyche - Mental status is at her baseline w/decreased ST recall.Affet is pleasant and smiles appropriately. Skin - neg. Assessment & Plan:   1. Dysuria - check u/a & c/s - Rx Cipro 250 mg #20 - 1 tab bid pending c/s.

## 2014-06-02 NOTE — Patient Instructions (Signed)
Urinary Tract Infection       Urinary tract infections (UTIs) can develop anywhere along your urinary tract. Your urinary tract is your body's drainage system for removing wastes and extra water. Your urinary tract includes two kidneys, two ureters, a bladder, and a urethra. Your kidneys are a pair of bean-shaped organs. Each kidney is about the size of your fist. They are located below your ribs, one on each side of your spine.   CAUSES   Infections are caused by microbes, which are microscopic organisms, including fungi, viruses, and bacteria. These organisms are so small that they can only be seen through a microscope. Bacteria are the microbes that most commonly cause UTIs.   SYMPTOMS   Symptoms of UTIs may vary by age and gender of the patient and by the location of the infection. Symptoms in young women typically include a frequent and intense urge to urinate and a painful, burning feeling in the bladder or urethra during urination. Older women and men are more likely to be tired, shaky, and weak and have muscle aches and abdominal pain. A fever may mean the infection is in your kidneys. Other symptoms of a kidney infection include pain in your back or sides below the ribs, nausea, and vomiting.   DIAGNOSIS   To diagnose a UTI, your caregiver will ask you about your symptoms. Your caregiver also will ask to provide a urine sample. The urine sample will be tested for bacteria and white blood cells. White blood cells are made by your body to help fight infection.   TREATMENT   Typically, UTIs can be treated with medication. Because most UTIs are caused by a bacterial infection, they usually can be treated with the use of antibiotics. The choice of antibiotic and length of treatment depend on your symptoms and the type of bacteria causing your infection.   HOME CARE INSTRUCTIONS   If you were prescribed antibiotics, take them exactly as your caregiver instructs you. Finish the medication even if you feel better  after you have only taken some of the medication.   Drink enough water and fluids to keep your urine clear or pale yellow.   Avoid caffeine, tea, and carbonated beverages. They tend to irritate your bladder.   Empty your bladder often. Avoid holding urine for long periods of time.   Empty your bladder before and after sexual intercourse.   After a bowel movement, women should cleanse from front to back. Use each tissue only once.  SEEK MEDICAL CARE IF:   You have back pain.   You develop a fever.   Your symptoms do not begin to resolve within 3 days.  SEEK IMMEDIATE MEDICAL CARE IF:   You have severe back pain or lower abdominal pain.   You develop chills.   You have nausea or vomiting.   You have continued burning or discomfort with urination.  MAKE SURE YOU:   Understand these instructions.   Will watch your condition.   Will get help right away if you are not doing well or get worse.

## 2014-06-03 LAB — URINALYSIS, MICROSCOPIC ONLY
Bacteria, UA: NONE SEEN
CASTS: NONE SEEN
Squamous Epithelial / LPF: NONE SEEN

## 2014-06-04 LAB — URINE CULTURE

## 2014-06-15 ENCOUNTER — Other Ambulatory Visit: Payer: Self-pay

## 2014-06-15 MED ORDER — SOLIFENACIN SUCCINATE 5 MG PO TABS: ORAL_TABLET | ORAL | Status: DC

## 2014-06-15 NOTE — Progress Notes (Signed)
MEDICARE ANNUAL WELLNESS VISIT AND FOLLOW UP  Assessment:   Prevnar 13 next visit  1. Essential hypertension - CBC with Differential - BASIC METABOLIC PANEL WITH GFR - Hepatic function panel - TSH  2. T2 NIDDM w/Stage 1 CKD  Discussed general issues about diabetes pathophysiology and management., Educational material distributed., Suggested low cholesterol diet., Encouraged aerobic exercise., Discussed foot care., Reminded to get yearly retinal exam. - Hemoglobin A1c - Insulin, fasting - HM DIABETES FOOT EXAM  3. SDAT MMSE 14/30 Check labs, stop vesicare and follow up with Dr. Anne HahnWillis, recent normal UA  4. Encounter for long-term (current) use of other medications - Magnesium  5. Hyperlipidemia - Lipid panel  6. Vitamin D Deficiency - Vit D  25 hydroxy (rtn osteoporosis monitoring)  7. B12 deficiency - Vitamin B12  8. Estrogen deficiency - DG Bone Density; Future  9. LLQ pain with diarrhea Check CBC, if elevated or worse pain may need ABX/CT    Plan:   During the course of the visit the patient was educated and counseled about appropriate screening and preventive services including:    Pneumococcal vaccine   Influenza vaccine  Td vaccine  Screening electrocardiogram  Screening mammography  Bone densitometry screening  Colorectal cancer screening  Diabetes screening  Glaucoma screening  Nutrition counseling   Advanced directives: given info/requested  Screening recommendations, referrals:  Vaccinations: Tdap vaccine not indicated Influenza vaccine requested Pneumococcal vaccine not indicated Shingles vaccine not indicated Hep B vaccine not indicated  Nutrition assessed and recommended  Colonoscopy up to date Mammogram up to date Pap smear not indicated Pelvic exam not indicated Recommended yearly ophthalmology/optometry visit for glaucoma screening and checkup Recommended yearly dental visit for hygiene and checkup Advanced directives  - requested  Conditions/risks identified: BMI: Discussed weight loss, diet, and increase physical activity.  Increase physical activity: AHA recommends 150 minutes of physical activity a week.  Medications reviewed DEXA- requested Diabetes is not at goal, ACE/ARB therapy: Yes. Urinary Incontinence is an issue: discussed non pharmacology and pharmacology options.  Fall risk: high- discussed PT, home fall assessment, medications.    Subjective:   Amy [name] is a 73 y.o. female who presents for Medicare Annual Wellness Visit and 3 month follow up on hypertension, prediabetes, hyperlipidemia, vitamin D def.  Date of last medicare wellness visit is unknown.   Her blood pressure has been controlled at home, today their BP is BP: 132/68 mmHg She does workout. She denies chest pain, shortness of breath, dizziness.  She is on cholesterol medication and denies myalgias. Her cholesterol is at goal. The cholesterol last visit was:   Lab Results  Component Value Date   CHOL 187 03/16/2014   HDL 46 03/16/2014   LDLCALC 90 03/16/2014   TRIG 257* 03/16/2014   CHOLHDL 4.1 03/16/2014   She has been working on diet and exercise for diabetes, and denies polydipsia and polyuria. Last A1C in the office was:  Lab Results  Component Value Date   HGBA1C 7.1* 03/16/2014   Patient is on Vitamin D supplement. Lab Results  Component Value Date   VD25OH 9691* 03/16/2014    She has had progressive mental decline and follows with Dr. Anne HahnWillis. She needs moderate help with her ADL's, she is on exelon, aricept and celexa. Daughter and full time care giver is here with them, daughter states that she has good and bad days. Today she is having trouble signing her name. She had a negative urine last Wednesday.   She does  have urinary incontinence and takes vesicare. Has supportive family.   She has had diarrhea today, some GERD/water brash in her mouth.  Names of Other Physician/Practitioners you currently use: 1.  [facility]- here for primary care 2. Dr. Ninetta Lights , dentist, last visit q 6 months Patient Care Team: Lucky Cowboy, MD as PCP - General (Internal Medicine) Manning Charity, OD as Referring Physician (Optometry)- yearly, and saw June 2014, has cataract York Spaniel, MD as Consulting Physician (Neurology) Hart Carwin, MD as Consulting Physician (Gastroenterology)  Medication Review Current Outpatient Prescriptions on File Prior to Visit  Medication Sig Dispense Refill  . aspirin 81 MG tablet Take 81 mg by mouth daily.        Marland Kitchen BAYER CONTOUR TEST test strip       . cetirizine (ZYRTEC) 10 MG tablet Take 10 mg by mouth daily as needed for allergies.      . Cholecalciferol 5000 UNITS TABS Take 2 tablets by mouth daily.      . citalopram (CELEXA) 20 MG tablet Take 1 tablet (20 mg total) by mouth daily.  30 tablet  6  . donepezil (ARICEPT) 10 MG tablet Take 1 tablet (10 mg total) by mouth every evening. At 7pm  30 tablet  6  . enalapril (VASOTEC) 20 MG tablet Take 1 tablet (20 mg total) by mouth daily.  30 tablet  6  . fenofibrate micronized (LOFIBRA) 134 MG capsule Take 1 capsule (134 mg total) by mouth daily.  30 capsule  6  . Magnesium Oxide 250 MG TABS Take 1 tablet by mouth daily.      . Memantine HCl ER (NAMENDA XR) 28 MG CP24 Take 28 mg by mouth every evening. At 5pm  30 capsule  6  . metFORMIN (GLUCOPHAGE-XR) 500 MG 24 hr tablet Take 1 tablet (500 mg total) by mouth daily with breakfast.  30 tablet  3  . OMEGA 3 1000 MG CAPS Take 1 capsule by mouth daily.      . rivastigmine (EXELON) 4.6 mg/24hr Place 1 patch (4.6 mg total) onto the skin daily.  30 patch  12  . solifenacin (VESICARE) 5 MG tablet TAKE 1 TABLET BY MOUTH ONCE DAILY-BLADDER CONTROL-  30 tablet  3   No current facility-administered medications on file prior to visit.    Current Problems (verified) Patient Active Problem List   Diagnosis Date Noted  . Encounter for long-term  (current) use of other medications 03/16/2014  . SDAT 03/16/2014  . Vitamin D Deficiency 03/16/2014  . T2 NIDDM w/Stage 1 CKD    . HTN (hypertension)   . Hyperlipidemia 09/29/2009    Screening Tests Health Maintenance  Topic Date Due  . Foot Exam  04/08/1951  . Ophthalmology Exam  04/08/1951  . Pneumococcal Polysaccharide Vaccine Age 9 And Over  04/09/2007  . Influenza Vaccine  06/25/2014  . Hemoglobin A1c  09/15/2014  . Urine Microalbumin  03/17/2015  . Mammogram  08/06/2015  . Tetanus/tdap  12/19/2018  . Colonoscopy  07/19/2020  . Zostavax  Completed     Immunization History  Administered Date(s) Administered  . Pneumococcal-Unspecified 12/19/2006  . Tdap 12/19/2008  . Zoster 12/19/2008    Preventative care: Last colonoscopy: 2011 due 10 years Last mammogram: 07/2013 Last pap smear/pelvic exam: remote  DEXA:2009- DUE Myoview stress neg 04/2012 MRI brain neg 2011  CT brain neg 04/2012 Last MMSE 21/30 CXR 07/2013  Prior vaccinations: TD or Tdap: 2010  Influenza: 2014 Pneumococcal:  2008 Prevnar 13 DUE Shingles/Zostavax: 2010  History reviewed: allergies, current medications, past family history, past medical history, past social history, past surgical history and problem list  Risk Factors: Osteoporosis: postmenopausal estrogen deficiency and dietary calcium and/or vitamin D deficiency History of fracture in the past year: no  Tobacco History  Substance Use Topics  . Smoking status: Former Smoker -- 5 years    Quit date: 10/20/1987  . Smokeless tobacco: Never Used  . Alcohol Use: No   She does not smoke.  Patient is a former smoker. Are there smokers in your home (other than you)?  No  Alcohol Current alcohol use: none  Caffeine Current caffeine use: coffee 1 /day  Exercise Current exercise: none  Nutrition/Diet Current diet: in general, a "healthy" diet    Cardiac risk factors: advanced age (older than 69 for men, 23 for women),  dyslipidemia, family history of premature cardiovascular disease, hypertension and sedentary lifestyle.  Depression Screen (Note: if answer to either of the following is "Yes", a more complete depression screening is indicated)   Q1: Over the past two weeks, have you felt down, depressed or hopeless? No  Q2: Over the past two weeks, have you felt little interest or pleasure in doing things? No  Have you lost interest or pleasure in daily life? No  Do you often feel hopeless? No  Do you cry easily over simple problems? No  Activities of Daily Living In your present state of health, do you have any difficulty performing the following activities?:  Driving? Yes Managing money?  Yes Feeding yourself? No Getting from bed to chair? No Climbing a flight of stairs? No Preparing food and eating?: Yes Bathing or showering? No Getting dressed: No Getting to the toilet? No Using the toilet:No Moving around from place to place: No In the past year have you fallen or had a near fall?:No   Are you sexually active?  No  Do you have more than one partner?  No  Vision Difficulties: Yes  Hearing Difficulties: Yes Do you often ask people to speak up or repeat themselves? Yes Do you experience ringing or noises in your ears? No Do you have difficulty understanding soft or whispered voices? No  Cognition  Do you feel that you have a problem with memory?Yes  Do you often misplace items? Yes  Do you feel safe at home?  Yes  Advanced directives Does patient have a Health Care Power of Attorney? Yes Does patient have a Living Will? Yes   Objective:   Blood pressure 132/68, pulse 88, temperature 97.9 F (36.6 C), resp. rate 16, weight 158 lb (71.668 kg). Body mass index is 29.15 kg/(m^2).  General appearance: alert, no distress, WD/WN,  female Cognitive Testing  Alert? Yes  Normal Appearance?Yes  Oriented to person? Yes  Place? Yes   Time? No  Recall of three objects?  No  Can perform  simple calculations? No  Displays appropriate judgment?No  Can read the correct time from a watch face?No  HEENT: normocephalic, sclerae anicteric, TMs pearly, nares patent, no discharge or erythema, pharynx normal Oral cavity: MMM, no lesions Neck: supple, no lymphadenopathy, no thyromegaly, no masses Heart: RRR, normal S1, S2, no murmurs Lungs: CTA bilaterally, no wheezes, rhonchi, or rales Abdomen: +bs, soft, LLQ tenderness without rebound, non distended, no masses, no hepatomegaly, no splenomegaly Musculoskeletal: nontender, no swelling, no obvious deformity Extremities: no edema, no cyanosis, no clubbing Pulses: 2+ symmetric, upper and lower extremities, normal cap refill Neurological: alert,  oriented x 2,  CN2-12 intact, strength normal upper extremities and lower extremities, sensation decreased bilateral feet, DTRs 2+ throughout, no cerebellar signs, gait slow but steady Psychiatric: Mental status is slightly worse than baseline .Pleasant and smiles appropriately. Breast: defer Gyn: defer Rectal: defer  Medicare Attestation I have personally reviewed: The patient's medical and social history Their use of alcohol, tobacco or illicit drugs Their current medications and supplements The patient's functional ability including ADLs,fall risks, home safety risks, cognitive, and hearing and visual impairment Diet and physical activities Evidence for depression or mood disorders  The patient's weight, height, BMI, and visual acuity have been recorded in the chart.  I have made referrals, counseling, and provided education to the patient based on review of the above and I have provided the patient with a written personalized care plan for preventive services.     Quentin Mulling, PA-C   06/16/2014

## 2014-06-16 ENCOUNTER — Encounter: Payer: Self-pay | Admitting: Physician Assistant

## 2014-06-16 ENCOUNTER — Ambulatory Visit (INDEPENDENT_AMBULATORY_CARE_PROVIDER_SITE_OTHER): Payer: Medicare Other | Admitting: Physician Assistant

## 2014-06-16 VITALS — BP 132/68 | HR 88 | Temp 97.9°F | Resp 16 | Wt 158.0 lb

## 2014-06-16 DIAGNOSIS — Z0000 Encounter for general adult medical examination without abnormal findings: Secondary | ICD-10-CM

## 2014-06-16 DIAGNOSIS — F0280 Dementia in other diseases classified elsewhere without behavioral disturbance: Secondary | ICD-10-CM

## 2014-06-16 DIAGNOSIS — Z1331 Encounter for screening for depression: Secondary | ICD-10-CM

## 2014-06-16 DIAGNOSIS — E559 Vitamin D deficiency, unspecified: Secondary | ICD-10-CM

## 2014-06-16 DIAGNOSIS — Z9181 History of falling: Secondary | ICD-10-CM

## 2014-06-16 DIAGNOSIS — E538 Deficiency of other specified B group vitamins: Secondary | ICD-10-CM

## 2014-06-16 DIAGNOSIS — G309 Alzheimer's disease, unspecified: Secondary | ICD-10-CM

## 2014-06-16 DIAGNOSIS — Z79899 Other long term (current) drug therapy: Secondary | ICD-10-CM

## 2014-06-16 DIAGNOSIS — E782 Mixed hyperlipidemia: Secondary | ICD-10-CM

## 2014-06-16 DIAGNOSIS — G301 Alzheimer's disease with late onset: Secondary | ICD-10-CM

## 2014-06-16 DIAGNOSIS — E1129 Type 2 diabetes mellitus with other diabetic kidney complication: Secondary | ICD-10-CM

## 2014-06-16 DIAGNOSIS — R413 Other amnesia: Secondary | ICD-10-CM

## 2014-06-16 DIAGNOSIS — I10 Essential (primary) hypertension: Secondary | ICD-10-CM

## 2014-06-16 DIAGNOSIS — E2839 Other primary ovarian failure: Secondary | ICD-10-CM

## 2014-06-16 LAB — CBC WITH DIFFERENTIAL/PLATELET
BASOS ABS: 0.1 10*3/uL (ref 0.0–0.1)
BASOS PCT: 1 % (ref 0–1)
EOS ABS: 0.2 10*3/uL (ref 0.0–0.7)
EOS PCT: 3 % (ref 0–5)
HCT: 39.1 % (ref 36.0–46.0)
Hemoglobin: 13.3 g/dL (ref 12.0–15.0)
Lymphocytes Relative: 27 % (ref 12–46)
Lymphs Abs: 1.8 10*3/uL (ref 0.7–4.0)
MCH: 30.3 pg (ref 26.0–34.0)
MCHC: 34 g/dL (ref 30.0–36.0)
MCV: 89.1 fL (ref 78.0–100.0)
Monocytes Absolute: 0.6 10*3/uL (ref 0.1–1.0)
Monocytes Relative: 9 % (ref 3–12)
NEUTROS PCT: 60 % (ref 43–77)
Neutro Abs: 4 10*3/uL (ref 1.7–7.7)
PLATELETS: 376 10*3/uL (ref 150–400)
RBC: 4.39 MIL/uL (ref 3.87–5.11)
RDW: 13 % (ref 11.5–15.5)
WBC: 6.6 10*3/uL (ref 4.0–10.5)

## 2014-06-16 LAB — HEMOGLOBIN A1C
HEMOGLOBIN A1C: 7 % — AB (ref <5.7)
MEAN PLASMA GLUCOSE: 154 mg/dL — AB (ref <117)

## 2014-06-16 NOTE — Patient Instructions (Addendum)
Dr. Anne HahnWillis Phone: [phone];   Stop vesicare  Preventative Care for Adults - Female      MAINTAIN REGULAR HEALTH EXAMS:  A routine yearly physical is a good way to check in with your primary care provider about your health and preventive screening. It is also an opportunity to share updates about your health and any concerns you have, and receive a thorough all-over exam.   Most health insurance companies pay for at least some preventative services.  Check with your health plan for specific coverages.  WHAT PREVENTATIVE SERVICES DO WOMEN NEED?  Adult women should have their weight and blood pressure checked regularly.   Women age 72 and older should have their cholesterol levels checked regularly.  Women should be screened for cervical cancer with a Pap smear and pelvic exam beginning at either age 72, or 3 years after they become sexually activity.    Breast cancer screening generally begins at age 340 with a mammogram and breast exam by your primary care provider.    Beginning at age 72 and continuing to age 72, women should be screened for colorectal cancer.  Certain people may need continued testing until age 785.  Updating vaccinations is part of preventative care.  Vaccinations help protect against diseases such as the flu.  Osteoporosis is a disease in which the bones lose minerals and strength as we age. Women ages 8065 and over should discuss this with their caregivers, as should women after menopause who have other risk factors.  Lab tests are generally done as part of preventative care to screen for anemia and blood disorders, to screen for problems with the kidneys and liver, to screen for bladder problems, to check blood sugar, and to check your cholesterol level.  Preventative services generally include counseling about diet, exercise, avoiding tobacco, drugs, excessive alcohol consumption, and sexually transmitted infections.    GENERAL RECOMMENDATIONS FOR GOOD  HEALTH:  Healthy diet:  Eat a variety of foods, including fruit, vegetables, animal or vegetable protein, such as meat, fish, chicken, and eggs, or beans, lentils, tofu, and grains, such as rice.  Drink plenty of water daily.  Decrease saturated fat in the diet, avoid lots of red meat, processed foods, sweets, fast foods, and fried foods.  Exercise:  Aerobic exercise helps maintain good heart health. At least 30-40 minutes of moderate-intensity exercise is recommended. For example, a brisk walk that increases your heart rate and breathing. This should be done on most days of the week.   Find a type of exercise or a variety of exercises that you enjoy so that it becomes a part of your daily life.  Examples are running, walking, swimming, water aerobics, and biking.  For motivation and support, explore group exercise such as aerobic class, spin class, Zumba, Yoga,or  martial arts, etc.    Set exercise goals for yourself, such as a certain weight goal, walk or run in a race such as a 5k walk/run.  Speak to your primary care provider about exercise goals.  Disease prevention:  If you smoke or chew tobacco, find out from your caregiver how to quit. It can literally save your life, no matter how long you have been a tobacco user. If you do not use tobacco, never begin.   Maintain a healthy diet and normal weight. Increased weight leads to problems with blood pressure and diabetes.   The Body Mass Index or BMI is a way of measuring how much of your body is fat. Having a  BMI above 27 increases the risk of heart disease, diabetes, hypertension, stroke and other problems related to obesity. Your caregiver can help determine your BMI and based on it develop an exercise and dietary program to help you achieve or maintain this important measurement at a healthful level.  High blood pressure causes heart and blood vessel problems.  Persistent high blood pressure should be treated with medicine if weight  loss and exercise do not work.   Fat and cholesterol leaves deposits in your arteries that can block them. This causes heart disease and vessel disease elsewhere in your body.  If your cholesterol is found to be high, or if you have heart disease or certain other medical conditions, then you may need to have your cholesterol monitored frequently and be treated with medication.   Ask if you should have a cardiac stress test if your history suggests this. A stress test is a test done on a treadmill that looks for heart disease. This test can find disease prior to there being a problem.  Menopause can be associated with physical symptoms and risks. Hormone replacement therapy is available to decrease these. You should talk to your caregiver about whether starting or continuing to take hormones is right for you.   Osteoporosis is a disease in which the bones lose minerals and strength as we age. This can result in serious bone fractures. Risk of osteoporosis can be identified using a bone density scan. Women ages 53 and over should discuss this with their caregivers, as should women after menopause who have other risk factors. Ask your caregiver whether you should be taking a calcium supplement and Vitamin D, to reduce the rate of osteoporosis.   Avoid drinking alcohol in excess (more than two drinks per day).  Avoid use of street drugs. Do not share needles with anyone. Ask for professional help if you need assistance or instructions on stopping the use of alcohol, cigarettes, and/or drugs.  Brush your teeth twice a day with fluoride toothpaste, and floss once a day. Good oral hygiene prevents tooth decay and gum disease. The problems can be painful, unattractive, and can cause other health problems. Visit your dentist for a routine oral and dental check up and preventive care every 6-12 months.   Look at your skin regularly.  Use a mirror to look at your back. Notify your caregivers of changes in moles,  especially if there are changes in shapes, colors, a size larger than a pencil eraser, an irregular border, or development of new moles.  Safety:  Use seatbelts 100% of the time, whether driving or as a passenger.  Use safety devices such as hearing protection if you work in environments with loud noise or significant background noise.  Use safety glasses when doing any work that could send debris in to the eyes.  Use a helmet if you ride a bike or motorcycle.  Use appropriate safety gear for contact sports.  Talk to your caregiver about gun safety.  Use sunscreen with a SPF (or skin protection factor) of 15 or greater.  Lighter skinned people are at a greater risk of skin cancer. Don't forget to also wear sunglasses in order to protect your eyes from too much damaging sunlight. Damaging sunlight can accelerate cataract formation.   Practice safe sex. Use condoms. Condoms are used for birth control and to help reduce the spread of sexually transmitted infections (or STIs).  Some of the STIs are gonorrhea (the clap), chlamydia, syphilis, trichomonas, herpes,  HPV (human papilloma virus) and HIV (human immunodeficiency virus) which causes AIDS. The herpes, HIV and HPV are viral illnesses that have no cure. These can result in disability, cancer and death.   Keep carbon monoxide and smoke detectors in your home functioning at all times. Change the batteries every 6 months or use a model that plugs into the wall.   Vaccinations:  Stay up to date with your tetanus shots and other required immunizations. You should have a booster for tetanus every 10 years. Be sure to get your flu shot every year, since 5%-20% of the [place] population comes down with the flu. The flu vaccine changes each year, so being vaccinated once is not enough. Get your shot in the fall, before the flu season peaks.   Other vaccines to consider:  Human Papilloma Virus or HPV causes cancer of the cervix, and other infections that can be  transmitted from person to person. There is a vaccine for HPV, and females should get immunized between the ages of 26 and 64. It requires a series of 3 shots.   Pneumococcal vaccine to protect against certain types of pneumonia.  This is normally recommended for adults age 82 or older.  However, adults younger than 72 years old with certain underlying conditions such as diabetes, heart or lung disease should also receive the vaccine.  Shingles vaccine to protect against Varicella Zoster if you are older than age 77, or younger than 72 years old with certain underlying illness.  Hepatitis A vaccine to protect against a form of infection of the liver by a virus acquired from food.  Hepatitis B vaccine to protect against a form of infection of the liver by a virus acquired from blood or body fluids, particularly if you work in health care.  If you plan to travel internationally, check with your local health department for specific vaccination recommendations.  Cancer Screening:  Breast cancer screening is essential to preventive care for women. All women age 72 and older should perform a breast self-exam every month. At age 4 and older, women should have their caregiver complete a breast exam each year. Women at ages 33 and older should have a mammogram (x-ray film) of the breasts. Your caregiver can discuss how often you need mammograms.    Cervical cancer screening includes taking a Pap smear (sample of cells examined under a microscope) from the cervix (end of the uterus). It also includes testing for HPV (Human Papilloma Virus, which can cause cervical cancer). Screening and a pelvic exam should begin at age 50, or 3 years after a woman becomes sexually active. Screening should occur every year, with a Pap smear but no HPV testing, up to age 70. After age 54, you should have a Pap smear every 3 years with HPV testing, if no HPV was found previously.   Most routine colon cancer screening begins  at the age of 49. On a yearly basis, doctors may provide special easy to use take-home tests to check for hidden blood in the stool. Sigmoidoscopy or colonoscopy can detect the earliest forms of colon cancer and is life saving. These tests use a small camera at the end of a tube to directly examine the colon. Speak to your caregiver about this at age 79, when routine screening begins (and is repeated every 5 years unless early forms of pre-cancerous polyps or small growths are found).

## 2014-06-17 LAB — BASIC METABOLIC PANEL WITH GFR
BUN: 14 mg/dL (ref 6–23)
CO2: 27 mEq/L (ref 19–32)
Calcium: 9.6 mg/dL (ref 8.4–10.5)
Chloride: 99 mEq/L (ref 96–112)
Creat: 0.68 mg/dL (ref 0.50–1.10)
GFR, Est Non African American: 88 mL/min
Glucose, Bld: 164 mg/dL — ABNORMAL HIGH (ref 70–99)
Potassium: 4.3 mEq/L (ref 3.5–5.3)
SODIUM: 136 meq/L (ref 135–145)

## 2014-06-17 LAB — VITAMIN B12: Vitamin B-12: 535 pg/mL (ref 211–911)

## 2014-06-17 LAB — LIPID PANEL
CHOL/HDL RATIO: 3.9 ratio
CHOLESTEROL: 179 mg/dL (ref 0–200)
HDL: 46 mg/dL (ref >39)
LDL Cholesterol: 74 mg/dL (ref 0–99)
Triglycerides: 297 mg/dL — ABNORMAL HIGH (ref <150)
VLDL: 59 mg/dL — AB (ref 0–40)

## 2014-06-17 LAB — HEPATIC FUNCTION PANEL
ALBUMIN: 4.2 g/dL (ref 3.5–5.2)
ALT: 16 U/L (ref 0–35)
AST: 17 U/L (ref 0–37)
Alkaline Phosphatase: 57 U/L (ref 39–117)
BILIRUBIN DIRECT: 0.1 mg/dL (ref 0.0–0.3)
BILIRUBIN TOTAL: 0.3 mg/dL (ref 0.2–1.2)
Indirect Bilirubin: 0.2 mg/dL (ref 0.2–1.2)
Total Protein: 6.8 g/dL (ref 6.0–8.3)

## 2014-06-17 LAB — MAGNESIUM: Magnesium: 1.7 mg/dL (ref 1.5–2.5)

## 2014-06-17 LAB — VITAMIN D 25 HYDROXY (VIT D DEFICIENCY, FRACTURES): VIT D 25 HYDROXY: 86 ng/mL (ref 30–89)

## 2014-06-17 LAB — INSULIN, FASTING: Insulin fasting, serum: 188 u[IU]/mL — ABNORMAL HIGH (ref 3–28)

## 2014-06-17 LAB — TSH: TSH: 2.5 u[IU]/mL (ref 0.350–4.500)

## 2014-08-23 ENCOUNTER — Other Ambulatory Visit: Payer: Self-pay | Admitting: Internal Medicine

## 2014-09-05 ENCOUNTER — Other Ambulatory Visit: Payer: Self-pay | Admitting: *Deleted

## 2014-09-05 MED ORDER — ENALAPRIL MALEATE 20 MG PO TABS: 20.0000 mg | ORAL_TABLET | Freq: Every day | ORAL | Status: DC

## 2014-09-05 MED ORDER — DONEPEZIL HCL 10 MG PO TABS: 10.0000 mg | ORAL_TABLET | Freq: Every evening | ORAL | Status: DC

## 2014-09-07 ENCOUNTER — Other Ambulatory Visit: Payer: Self-pay | Admitting: Internal Medicine

## 2014-09-19 ENCOUNTER — Other Ambulatory Visit: Payer: Self-pay | Admitting: Internal Medicine

## 2014-09-19 ENCOUNTER — Other Ambulatory Visit: Payer: Self-pay

## 2014-09-19 MED ORDER — FENOFIBRATE MICRONIZED 134 MG PO CAPS: 134.0000 mg | ORAL_CAPSULE | Freq: Every day | ORAL | Status: DC

## 2014-09-19 MED ORDER — CITALOPRAM HYDROBROMIDE 20 MG PO TABS: 20.0000 mg | ORAL_TABLET | Freq: Every day | ORAL | Status: DC

## 2014-09-19 MED ORDER — METFORMIN HCL ER 500 MG PO TB24: 500.0000 mg | ORAL_TABLET | Freq: Every day | ORAL | Status: DC

## 2014-09-21 ENCOUNTER — Ambulatory Visit: Payer: Self-pay | Admitting: Internal Medicine

## 2014-10-07 ENCOUNTER — Other Ambulatory Visit: Payer: Self-pay | Admitting: Internal Medicine

## 2014-10-17 ENCOUNTER — Other Ambulatory Visit: Payer: Self-pay | Admitting: *Deleted

## 2014-10-17 MED ORDER — CITALOPRAM HYDROBROMIDE 20 MG PO TABS: 20.0000 mg | ORAL_TABLET | Freq: Every day | ORAL | Status: DC

## 2014-11-03 ENCOUNTER — Other Ambulatory Visit (HOSPITAL_COMMUNITY): Payer: Self-pay | Admitting: Internal Medicine

## 2014-11-03 DIAGNOSIS — Z1231 Encounter for screening mammogram for malignant neoplasm of breast: Secondary | ICD-10-CM

## 2014-11-09 ENCOUNTER — Ambulatory Visit (HOSPITAL_COMMUNITY)
Admission: RE | Admit: 2014-11-09 | Discharge: 2014-11-09 | Disposition: A | Discharge status: Home / Self Care | Payer: Medicare Other | Source: Ambulatory Visit | Attending: Internal Medicine | Admitting: Internal Medicine

## 2014-11-09 DIAGNOSIS — Z1231 Encounter for screening mammogram for malignant neoplasm of breast: Secondary | ICD-10-CM | POA: Insufficient documentation

## 2014-11-16 ENCOUNTER — Other Ambulatory Visit: Payer: Self-pay | Admitting: *Deleted

## 2014-11-21 ENCOUNTER — Other Ambulatory Visit: Payer: Self-pay | Admitting: Internal Medicine

## 2014-11-28 ENCOUNTER — Other Ambulatory Visit: Payer: Self-pay | Admitting: Physician Assistant

## 2014-12-05 ENCOUNTER — Other Ambulatory Visit: Payer: Self-pay | Admitting: Internal Medicine

## 2014-12-26 ENCOUNTER — Ambulatory Visit (INDEPENDENT_AMBULATORY_CARE_PROVIDER_SITE_OTHER): Payer: Medicare Other | Admitting: Podiatry

## 2014-12-26 ENCOUNTER — Encounter: Payer: Self-pay | Admitting: Podiatry

## 2014-12-26 VITALS — BP 133/71 | HR 72 | Resp 11 | Ht 63.0 in | Wt 153.0 lb

## 2014-12-26 DIAGNOSIS — L84 Corns and callosities: Secondary | ICD-10-CM

## 2014-12-26 DIAGNOSIS — E119 Type 2 diabetes mellitus without complications: Secondary | ICD-10-CM | POA: Diagnosis not present

## 2014-12-26 NOTE — Patient Instructions (Signed)
Diabetes and Foot Care Diabetes may cause you to have problems because of poor blood supply (circulation) to your feet and legs. This may cause the skin on your feet to become thinner, break easier, and heal more slowly. Your skin may become dry, and the skin may peel and crack. You may also have nerve damage in your legs and feet causing decreased feeling in them. You may not notice minor injuries to your feet that could lead to infections or more serious problems. Taking care of your feet is one of the most important things you can do for yourself.  HOME CARE INSTRUCTIONS  Wear shoes at all times, even in the house. Do not go barefoot. Bare feet are easily injured.  Check your feet daily for blisters, cuts, and redness. If you cannot see the bottom of your feet, use a mirror or ask someone for help.  Wash your feet with warm water (do not use hot water) and mild soap. Then pat your feet and the areas between your toes until they are completely dry. Do not soak your feet as this can dry your skin.  Apply a moisturizing lotion or petroleum jelly (that does not contain alcohol and is unscented) to the skin on your feet and to dry, brittle toenails. Do not apply lotion between your toes.  Trim your toenails straight across. Do not dig under them or around the cuticle. File the edges of your nails with an emery board or nail file.  Do not cut corns or calluses or try to remove them with medicine.  Wear clean socks or stockings every day. Make sure they are not too tight. Do not wear knee-high stockings since they may decrease blood flow to your legs.  Wear shoes that fit properly and have enough cushioning. To break in new shoes, wear them for just a few hours a day. This prevents you from injuring your feet. Always look in your shoes before you put them on to be sure there are no objects inside.  Do not cross your legs. This may decrease the blood flow to your feet.  If you find a minor scrape,  cut, or break in the skin on your feet, keep it and the skin around it clean and dry. These areas may be cleansed with mild soap and water. Do not cleanse the area with peroxide, alcohol, or iodine.  When you remove an adhesive bandage, be sure not to damage the skin around it.  If you have a wound, look at it several times a day to make sure it is healing.  Do not use heating pads or hot water bottles. They may burn your skin. If you have lost feeling in your feet or legs, you may not know it is happening until it is too late.  Make sure your health care provider performs a complete foot exam at least annually or more often if you have foot problems. Report any cuts, sores, or bruises to your health care provider immediately. SEEK MEDICAL CARE IF:   You have an injury that is not healing.  You have cuts or breaks in the skin.  You have an ingrown nail.  You notice redness on your legs or feet.  You feel burning or tingling in your legs or feet.  You have pain or cramps in your legs and feet.  Your legs or feet are numb.  Your feet always feel cold. SEEK IMMEDIATE MEDICAL CARE IF:   There is increasing redness,   swelling, or pain in or around a wound.  There is a red line that goes up your leg.  Pus is coming from a wound.  You develop a fever or as directed by your health care provider.  You notice a bad smell coming from an ulcer or wound. Document Released: 11/08/2000 Document Revised: 07/14/2013 Document Reviewed: 04/20/2013 [employer] Patient Information 2015 [employer]. This information is not intended to replace advice given to you by your health care provider. Make sure you discuss any questions you have with your health care provider.  

## 2014-12-26 NOTE — Progress Notes (Signed)
   Subjective:    Patient ID: Amy [name], female    DOB: Dec 07, 1940, 74 y.o.   MRN: [id number]  HPI Comments: N callouses and corn L B/L 1st medial toes and right 3rd distal toe, and left 2nd medial toe corn D and O long-term C hard, painful cracked callouses, and painful corn  A enclosed shoes  T none  As patient presents with her daughter with a history of memory difficulty and has difficulty responding to questioning and her daughters responding to questions   Review of Systems  Psychiatric/Behavioral:       Memory loss  All other systems reviewed and are negative.      Objective:   Physical Exam  Confused patient not able to respond to questioning with daughter present in room  Vascular: DP right 1/4 DP left 2/4 PT pulses 2/4 bilaterally  Neurological: Ankle reflex equal and reactive bilaterally Vibratory sensation patient confused not able to respond Sensation to 10 g monofilament wire patient confused not able to respond  Dermatological: Minimal Keratoses medial hallux bilaterally and distal third right toe Nucleated keratoses medial proximal interphalangeal joint second toe left  Musculoskeletal: HAV deformity left There is no restriction ankle, subtalar, midtarsal joints bilaterally       Assessment & Plan:   Assessment: Diabetes without any obvious complications Keratoses  Plan: Debrided keratoses on second left toe  Reappoint yearly or at patient's request     :

## 2015-02-07 ENCOUNTER — Other Ambulatory Visit: Payer: Self-pay | Admitting: Internal Medicine

## 2015-02-22 ENCOUNTER — Ambulatory Visit (INDEPENDENT_AMBULATORY_CARE_PROVIDER_SITE_OTHER): Payer: Medicare Other | Admitting: Neurology

## 2015-02-22 ENCOUNTER — Encounter: Payer: Self-pay | Admitting: Neurology

## 2015-02-22 VITALS — BP 130/79 | HR 82 | Ht 63.0 in | Wt 155.8 lb

## 2015-02-22 DIAGNOSIS — G309 Alzheimer's disease, unspecified: Secondary | ICD-10-CM | POA: Diagnosis not present

## 2015-02-22 DIAGNOSIS — F0280 Dementia in other diseases classified elsewhere without behavioral disturbance: Secondary | ICD-10-CM

## 2015-02-22 HISTORY — DX: Dementia in other diseases classified elsewhere, unspecified severity, without behavioral disturbance, psychotic disturbance, mood disturbance, and anxiety: F02.80

## 2015-02-22 MED ORDER — BUPROPION HCL ER (SR) 100 MG PO TB12: 100.0000 mg | ORAL_TABLET | Freq: Every day | ORAL | Status: DC

## 2015-02-22 NOTE — Patient Instructions (Addendum)
We will start a new medication, Wellbutrin at 100 mg, take 1 tablet daily. A prescription has been called in.   Alzheimer Disease Alzheimer disease is a mental disorder. It causes memory loss and loss of other mental functions, such as learning, thinking, problem solving, communicating, and completing tasks. The mental losses interfere with the ability to perform daily activities at work, at home, or in social situations. Alzheimer disease usually starts in a person's late 38s or early 66s but can start earlier in life (familial form). The mental changes caused by this disease are permanent and worsen over time. As the illness progresses, the ability to do even the simplest things is lost. Survival with Alzheimer disease ranges from several years to as long as 20 years. CAUSES Alzheimer disease is caused by abnormally high levels of a protein (beta-amyloid) in the brain. This protein forms very small deposits within and around the brain's nerve cells. These deposits prevent the nerve cells from working properly. Experts are not certain what causes the beta-amyloid deposits in this disease. RISK FACTORS The following major risk factors have been identified:  Increasing age.  Certain genetic variations, such as Down syndrome (trisomy 21). SYMPTOMS In the early stages of Alzheimer disease, you are still able to perform daily activities but need greater effort, more time, or memory aids. Early symptoms include:  Mild memory loss of recent events, names, or phone numbers.  Loss of objects.  Minor loss of vocabulary.  Difficulty with complex tasks, such as paying bills or driving in unfamiliar locations. Other mental functions deteriorate as the disease worsens. These changes slowly go from mild to severe. Symptoms at this stage include:  Difficulty remembering. You may not be able to recall personal information such as your address and telephone number. You may become confused about the date,  the season of the year, or your location.  Difficulty maintaining attention. You may forget what you wanted to say during conversations and repeat what you have already said.  Difficulty learning new information or tasks. You may not remember what you read or the name of a new friend you met.  Difficulty counting or doing math. You may have difficulty with complex math problems. You may make mistakes in paying bills or managing your checkbook.  Poor reasoning and judgment. You may make poor decisions or not dress right for the weather.  Difficulty communicating. You may have regular difficulty remembering words, naming objects, expressing yourself clearly, or writing sentences that make sense.  Difficulty performing familiar daily activities. You may get lost driving in familiar locations or need help eating, bathing, dressing, grooming, or using the toilet. You may have difficulty maintaining bladder or bowel control.  Difficulty recognizing familiar faces. You may confuse family members or close friends with one another. You may not recognize a close relative or may mistake strangers for family. Alzheimer disease also may cause changes in personality and behavior. These changes include:   Loss of interest or motivation.  Social withdrawal.  Anxiety.  Difficulty sleeping.  Uncharacteristic anger or combativeness.  A false belief that someone is trying to harm you (paranoia).  Seeing things that are not real (hallucinations).  Agitation. Confusion and disruptive behavior are often worse at night and may be triggered by changes in the environment or acute medical issues. DIAGNOSIS  Alzheimer disease is diagnosed through an assessment by your health care provider. During this assessment, your health care provider will do the following:  Ask you and your family, friends,  or caregivers questions about your symptoms, their frequency, their duration and progression, and the effect they  are having on your life.  Ask questions about your personal and family medical history and use of alcohol or drugs, including prescription medicine.  Perform a physical exam and order blood tests and brain imaging exams. Your health care provider may refer you to a specialist for detailed evaluation of your mental functions (neuropsychological testing).  Many different brain disorders, medical conditions, and certain substances can cause symptoms that resemble Alzheimer disease symptoms. These must be ruled out before this disease can be diagnosed. If Alzheimer disease is diagnosed, it will be considered either "possible" or "probable" Alzheimer disease. "Possible" Alzheimer disease means that your symptoms are typical of the disease and no other disorder is causing them. "Probable" Alzheimer disease means that you also have a family history of the disease or genetic test results that support the diagnosis. Certain tests, mostly used in research studies, are highly specific for Alzheimer disease.  TREATMENT  There is currently no cure for this disease. The goals of treatment are to:  Slow down the progression of the disease.  Preserve mental function as long as possible.  Manage behavioral symptoms.  Make life easier for the person with Alzheimer disease and his or her caregivers. The following treatment options are available:  Medicine. Certain medicines may help slow memory loss by changing the level of certain chemicals in the brain. Medicine may also help with behavioral symptoms.  Talk therapy. Talk therapy provides education, support, and memory aids for people with this disease. It is most effective in the early stages of the illness.  Caregiving. Caregivers may be family members, friends, or trained medical professionals. They help the person with Alzheimer disease with daily life activities. Caregiving may take place at home or at a nursing facility.  Family support groups. These  provide education, emotional support, and information about community resources to family members who are taking care of the person with this disease. Document Released: 07/23/2004 Document Revised: 03/28/2014 Document Reviewed: 03/19/2013 [facility] Patient Information 2015 [place], [place]. This information is not intended to replace advice given to you by your health care provider. Make sure you discuss any questions you have with your health care provider.

## 2015-02-22 NOTE — Progress Notes (Signed)
Reason for visit: Alzheimer's disease  Referring physician: Dr. Myrla Halsted is a 74 y.o. female  History of present illness:  Amy [name] is a 74 year old right-handed white female with a history of a progressive dementing illness. The patient began having memory issues in 2009. She was seen and evaluated through this office in June 2011. At that time, MRI of the brain was done and was unremarkable. The patient did not wish to go on medications for memory at that time. The patient has had ongoing progression of her memory problems, particularly over the last one year. She currently is residing in an extended care facility, [facility]. The patient has had some episodes of crying, and depression. She has not had any significant problems with reactive aggression. She has not had any overt hallucinations, but she has had delusional thinking at times. She denies any problems with balance, numbness or weakness of the extremities. She is able to control the bowels and the bladder. She is able to dress herself and feed herself and bathe herself, but she sometimes requires prompting. She needs help with keeping up with her medications. The patient currently is on Namenda and Exelon. She is tolerating the medications well. There have been no problems with weight loss or diarrhea on the Exelon. She takes Remeron 15 milligrams at night to help her sleep and for depression and anxiety.  Past Medical History  Diagnosis Date  . Type 2 diabetes mellitus   . Hypercholesterolemia with hyperglyceridemia   . HTN (hypertension)   . Chest pain     adenosine myoview (10/10) showed EF 72% and ischemia in the apical anterior wall and true apex. LHC (11/10): EF 60%, LVEDP 11, separate ostia for LAD and CFX, mild luminal irregularities in the LAD, no obstructive disease  . Anxiety   . Depression   . Dementia of the Alzheimer's type   . Alzheimer's disease 02/22/2015    Past Surgical History    Procedure Laterality Date  . Colonoscopy      07/02/2010  . Cardiac catheterization  2010    negative  . Tubal ligation    . Cataract extraction Bilateral     Family History  Problem Relation Age of Onset  . Coronary artery disease      No premature  . Cancer Mother 47    breast  . Hypertension Father   . Stroke Father   . Hypertension Brother   . Heart disease Brother   . Cancer Sister     breast  . Dementia Paternal Aunt   . Dementia Paternal Aunt   . Dementia Paternal Aunt     Social history:  reports that she quit smoking about 27 years ago. She has never used smokeless tobacco. She reports that she does not drink alcohol or use illicit drugs.  Medications:  Prior to Admission medications   Medication Sig Start Date End Date Taking? Authorizing Provider  aspirin 81 MG tablet Take 81 mg by mouth daily.     Yes Historical Provider, MD  BAYER CONTOUR TEST test strip USE TO CHECK GLUCOSE EVERY DAY 08/23/14  Yes Lucky Cowboy, MD  cetirizine (ZYRTEC) 10 MG tablet Take 10 mg by mouth daily as needed for allergies.   Yes Historical Provider, MD  cholecalciferol (VITAMIN D) 1000 UNITS tablet Take 1,000 Units by mouth daily.   Yes Historical Provider, MD  enalapril (VASOTEC) 20 MG tablet TAKE 1 TABLET BY MOUTH EVERY MORNING 09/07/14  Yes  Lucky Cowboy, MD  fenofibrate micronized (LOFIBRA) 134 MG capsule TAKE ONE CAPSULE BY MOUTH DAILY 10/08/14  Yes Lucky Cowboy, MD  Memantine HCl ER (NAMENDA XR) 28 MG CP24 Take 28 mg by mouth every evening. At 5pm 03/21/14  Yes Lucky Cowboy, MD  metFORMIN (GLUCOPHAGE-XR) 500 MG 24 hr tablet Take 1 tablet (500 mg total) by mouth daily with breakfast. 09/19/14  Yes Quentin Mulling, PA-C  mirtazapine (REMERON) 15 MG tablet Take 15 mg by mouth at bedtime.   Yes Historical Provider, MD  promethazine-codeine (PHENERGAN WITH CODEINE) 6.25-10 MG/5ML syrup Take 5 mLs by mouth every 6 (six) hours as needed for cough.   Yes Historical Provider, MD   rivastigmine (EXELON) 9.5 mg/24hr Place 9.5 mg onto the skin daily.   Yes Historical Provider, MD  b complex vitamins tablet Take 1 tablet by mouth daily.    Historical Provider, MD  Magnesium Oxide 250 MG TABS Take 1 tablet by mouth daily.    Historical Provider, MD  OMEGA 3 1000 MG CAPS Take 1 capsule by mouth daily.    Historical Provider, MD      Allergies  Allergen Reactions  . Latex   . Lipitor [Atorvastatin]   . Shellfish-Derived Products     ROS:  Out of a complete 14 system review of symptoms, the patient complains only of the following symptoms, and all other reviewed systems are negative.  Depression, anxiety Memory problems  Blood pressure 130/79, pulse 82, height  (1.6 m), weight 155 lb 12.8 oz (70.67 kg).  Physical Exam  General: The patient is alert and cooperative at the time of the examination.  Eyes: Pupils are equal, round, and reactive to light. Discs are flat bilaterally.  Neck: The neck is supple, no carotid bruits are noted.  Respiratory: The respiratory examination is clear.  Cardiovascular: The cardiovascular examination reveals a regular rate and rhythm, no obvious murmurs or rubs are noted.  Skin: Extremities are without significant edema.  Neurologic Exam  Mental status: The Mini-Mental Status Examination done today shows a total score of 7/30.   Cranial nerves: Facial symmetry is present. There is good sensation of the face to pinprick and soft touch bilaterally. The strength of the facial muscles and the muscles to head turning and shoulder shrug are normal bilaterally. Speech is well enunciated, no aphasia or dysarthria is noted. Extraocular movements are full. Visual fields are full. The tongue is midline, and the patient has symmetric elevation of the soft palate. No obvious hearing deficits are noted.  Motor: The motor testing reveals 5 over 5 strength of all 4 extremities. Good symmetric motor tone is noted throughout.  Sensory:  Sensory testing is intact to pinprick, soft touch, and vibration sensation on all 4 extremities. No evidence of extinction is noted.  Coordination: Cerebellar testing reveals good finger-nose-finger and heel-to-shin bilaterally. Apraxia with the use of the extremities is noted.  Gait and station: Gait is normal. Tandem gait is slightly unsteady. Romberg is negative. No drift is seen.  Reflexes: Deep tendon reflexes are symmetric and normal bilaterally. Toes are downgoing bilaterally.   CT head 04/24/12:  IMPRESSION: No acute intracranial abnormality. No interval change.  * CT scan images were reviewed online. I agree with the written report.    Assessment/Plan:  1. Alzheimer's disease  2. Anxiety and depression  The patient is having ongoing worsening of memory. The patient also has concurrent depression and anxiety. The patient will continue on the Namenda and Exelon at this time.  She will be placed on low-dose Wellbutrin for the depression which is still an ongoing issue. She will follow-up through this office in 6-8 months.  Marlan Palau. Keith Jalen Oberry MD 02/22/2015 7:48 PM  [facility] [address] [place], [place] [id number]  Phone [phone] Fax [phone]

## 2015-03-08 ENCOUNTER — Telehealth: Payer: Self-pay | Admitting: Neurology

## 2015-03-08 NOTE — Telephone Encounter (Signed)
Patient's daughter calling to state that Wellbutrin does not seem to be working for her mother and would like to try a different medication. [facility] (facility she resides at) suggest she be on an anxiety medication. She has been hyperventilating and very upset. She has been napping a lot and going to bed by 6 pm which is very out of character for her. Please call daughter, Tresa EndoKelly,  at [phone].

## 2015-03-08 NOTE — Telephone Encounter (Signed)
I called the daughter. The patient is having ongoing crying spells. The patient takes Remeron 15 mg at night. She is on Wellbutrin 100 mg during the day. We may try to go up on the Wellbutrin to see if this helps her. We'll go to 200 mg daily. I will fax a prescription into [facility] at [phone].

## 2015-03-13 ENCOUNTER — Telehealth: Payer: Self-pay | Admitting: Neurology

## 2015-03-13 NOTE — Telephone Encounter (Signed)
Patient's daughter states that Wellbutrin is being given as a nighttime mediation at [facility] and the daughter thought she discussed with Dr. Anne HahnWillis that this would be a daytime medication. Daughter can be reached at [phone].

## 2015-03-13 NOTE — Telephone Encounter (Signed)
I called Tresa EndoKelly. According to Dr. Anne HahnWillis' note the Wellbutrin is supposed to be taken during the day time. [facility] and spoke to BuhlEugenie, she stated that I could fax her the note that states to take it during the day and she would get it changed from night time to day time. I have faxed the note and received confirmation. I called Tresa EndoKelly back to inform her of this.

## 2015-03-20 ENCOUNTER — Encounter: Payer: Self-pay | Admitting: Internal Medicine

## 2015-03-21 NOTE — Telephone Encounter (Signed)
I called the daughter. The patient is somewhat worse on the Ativan with the anxiety issues. The patient is having panic attacks during the day. I will stop the Wellbutrin, change to Ativan taking 0.5 mg twice daily. They will contact me if she continues to have issues. [facility]

## 2015-03-21 NOTE — Telephone Encounter (Signed)
Patients daughter called Tresa Endo(Kelly, [phone]) stated that the patient is still having bad side effects from the wellbutrin, such as crying spells and being extremely anxious. Please call and advise.

## 2015-03-29 NOTE — Telephone Encounter (Signed)
Amy [name], pt's daughter called stating that [facility] and is still giving the patient Amy [name]. Please call and advice # [phone]

## 2015-03-30 ENCOUNTER — Telehealth: Payer: Self-pay | Admitting: Neurology

## 2015-03-30 NOTE — Telephone Encounter (Signed)
Amy [name] has spoken with them.  Previous note says:   Florencia ReasonsKelby B Lomax, RN at 03/30/2015 11:53 AM     Status: Signed       Expand All Collapse All   I faxed the Ativan Rx and Rx to d/c Wellbutrin. I spoke to [place]rienne again. She received the order but could not read it due to the background being too dark. She asked that I call their pharmacy at [phone]. I spoke to a pharmacist, Carollee HerterShannon, and gave her a verbal order for what Dr. Anne HahnWillis wrote for (Ativan 0.5 mg twice daily, 60 tablets, no refills and d/c Wellbutrin). She read back the order and stated she would make the change. I called Tresa EndoKelly to let her know.

## 2015-03-30 NOTE — Telephone Encounter (Signed)
Koleen NimrodAdrian with [facility] is calling regarding the patient's Rx Ativan that was [facility] to [facility]. Instructions are needed because the Rx that was faxed to them cannot be read. Please call.Thank you.

## 2015-03-30 NOTE — Telephone Encounter (Signed)
I faxed the Ativan Rx and Rx to d/c Wellbutrin. I spoke to [place] City[place]rienne again. She received the order but could not read it due to the background being too dark. She asked that I call their pharmacy at [phone]. I spoke to a pharmacist, Carollee HerterShannon, and gave her a verbal order for what Dr. Anne HahnWillis wrote for (Ativan 0.5 mg twice daily, 60 tablets, no refills and d/c Wellbutrin). She read back the order and stated she would make the change. I called Tresa EndoKelly to let her know.

## 2015-03-30 NOTE — Telephone Encounter (Signed)
I spoke to [place] at [facility]. She stated they did not receive the Ativan Rx. Their fax number is [phone]. We will send this again. I also called Tresa EndoKelly to let her know.

## 2015-08-23 ENCOUNTER — Ambulatory Visit (INDEPENDENT_AMBULATORY_CARE_PROVIDER_SITE_OTHER): Payer: Medicare Other | Admitting: Adult Health

## 2015-08-23 ENCOUNTER — Encounter: Payer: Self-pay | Admitting: Adult Health

## 2015-08-23 VITALS — BP 117/66 | HR 76 | Ht 62.0 in | Wt 155.0 lb

## 2015-08-23 DIAGNOSIS — R413 Other amnesia: Secondary | ICD-10-CM

## 2015-08-23 DIAGNOSIS — Z5181 Encounter for therapeutic drug level monitoring: Secondary | ICD-10-CM | POA: Diagnosis not present

## 2015-08-23 DIAGNOSIS — R400 Somnolence: Secondary | ICD-10-CM
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
## 2015-08-23 NOTE — Progress Notes (Signed)
I have read the note, and I agree with the clinical assessment and plan.  Amy [name],Amy [name]   

## 2015-08-23 NOTE — Patient Instructions (Addendum)
Continue on Namenda and Exelon Unsure if medication is causing drowsiness or if its due to her sleep routine.  If your symptoms worsen or you develop new symptoms please let us know.

## 2015-08-24 LAB — COMPREHENSIVE METABOLIC PANEL
A/G RATIO: 2 (ref 1.1–2.5)
ALK PHOS: 65 IU/L (ref 39–117)
ALT: 28 IU/L (ref 0–32)
AST: 22 IU/L (ref 0–40)
Albumin: 4.5 g/dL (ref 3.5–4.8)
BUN/Creatinine Ratio: 15 (ref 11–26)
BUN: 12 mg/dL (ref 8–27)
Bilirubin Total: <0.2 mg/dL (ref 0.0–1.2)
CO2: 24 mmol/L (ref 18–29)
Calcium: 9.8 mg/dL (ref 8.7–10.3)
Chloride: 95 mmol/L — ABNORMAL LOW (ref 97–108)
Creatinine, Ser: 0.78 mg/dL (ref 0.57–1.00)
GFR calc non Af Amer: 76 mL/min/{1.73_m2} (ref >59)
GFR, EST AFRICAN AMERICAN: 87 mL/min/{1.73_m2} (ref >59)
Globulin, Total: 2.2 g/dL (ref 1.5–4.5)
Glucose: 185 mg/dL — ABNORMAL HIGH (ref 65–99)
POTASSIUM: 4 mmol/L (ref 3.5–5.2)
SODIUM: 139 mmol/L (ref 134–144)
TOTAL PROTEIN: 6.7 g/dL (ref 6.0–8.5)

## 2015-08-24 LAB — CBC WITH DIFFERENTIAL/PLATELET
BASOS ABS: 0.1 10*3/uL (ref 0.0–0.2)
Basos: 1 %
EOS (ABSOLUTE): 0.2 10*3/uL (ref 0.0–0.4)
Eos: 3 %
Hematocrit: 39.3 % (ref 34.0–46.6)
Hemoglobin: 13 g/dL (ref 11.1–15.9)
IMMATURE GRANS (ABS): 0 10*3/uL (ref 0.0–0.1)
IMMATURE GRANULOCYTES: 0 %
LYMPHS: 30 %
Lymphocytes Absolute: 2.1 10*3/uL (ref 0.7–3.1)
MCH: 30.4 pg (ref 26.6–33.0)
MCHC: 33.1 g/dL (ref 31.5–35.7)
MCV: 92 fL (ref 79–97)
MONOS ABS: 0.6 10*3/uL (ref 0.1–0.9)
Monocytes: 8 %
NEUTROS PCT: 58 %
Neutrophils Absolute: 4.1 10*3/uL (ref 1.4–7.0)
Platelets: 386 10*3/uL — ABNORMAL HIGH (ref 150–379)
RBC: 4.28 x10E6/uL (ref 3.77–5.28)
RDW: 13.2 % (ref 12.3–15.4)
WBC: 7.1 10*3/uL (ref 3.4–10.8)

## 2015-08-24 LAB — AMMONIA: AMMONIA: 32 ug/dL (ref 19–87)

## 2015-08-24 LAB — VALPROIC ACID LEVEL: Valproic Acid Lvl: 27 ug/mL — ABNORMAL LOW (ref 50–100)

## 2015-08-28 ENCOUNTER — Telehealth: Payer: Self-pay

## 2015-08-28 NOTE — Telephone Encounter (Signed)
-----   Message from Butch Penny, NP sent at 08/28/2015  8:17 AM EDT ----- Lab work is ok. Please call the patient's family. And fax a copy to her nursing home.

## 2015-08-28 NOTE — Telephone Encounter (Addendum)
Spoke to [employer]. Gave lab results. Faxed copy to nursing home. [facility] fax# [phone])

## 2015-10-10 ENCOUNTER — Telehealth: Payer: Self-pay

## 2015-10-10 NOTE — Telephone Encounter (Signed)
Called patient to cancel appt on 12/27 and offer earlier appt due to NP-Megan being on vacation. No answer.  

## 2015-10-12 NOTE — Telephone Encounter (Signed)
Spoke to daughter. Explained calling to r/s appt since appt on 12/28 canceled. Daughter stated she will have to c/b to r/s.

## 2015-11-22 ENCOUNTER — Ambulatory Visit: Payer: Medicare Other | Admitting: Adult Health

## 2015-11-23 ENCOUNTER — Encounter (HOSPITAL_COMMUNITY): Payer: Self-pay | Admitting: Emergency Medicine

## 2015-11-23 ENCOUNTER — Emergency Department (HOSPITAL_COMMUNITY)
Admission: EM | Admit: 2015-11-23 | Discharge: 2015-11-24 | Disposition: A | Discharge status: Home / Self Care | Payer: Medicare Other | Attending: Emergency Medicine | Admitting: Emergency Medicine

## 2015-11-23 DIAGNOSIS — F419 Anxiety disorder, unspecified: Secondary | ICD-10-CM | POA: Insufficient documentation

## 2015-11-23 DIAGNOSIS — E119 Type 2 diabetes mellitus without complications: Secondary | ICD-10-CM | POA: Insufficient documentation

## 2015-11-23 DIAGNOSIS — Z91040 Latex allergy status: Secondary | ICD-10-CM | POA: Insufficient documentation

## 2015-11-23 DIAGNOSIS — F0281 Dementia in other diseases classified elsewhere with behavioral disturbance: Secondary | ICD-10-CM | POA: Diagnosis not present

## 2015-11-23 DIAGNOSIS — F329 Major depressive disorder, single episode, unspecified: Secondary | ICD-10-CM | POA: Diagnosis not present

## 2015-11-23 DIAGNOSIS — I10 Essential (primary) hypertension: Secondary | ICD-10-CM | POA: Insufficient documentation

## 2015-11-23 DIAGNOSIS — Z87891 Personal history of nicotine dependence: Secondary | ICD-10-CM | POA: Insufficient documentation

## 2015-11-23 DIAGNOSIS — Z7984 Long term (current) use of oral hypoglycemic drugs: Secondary | ICD-10-CM | POA: Diagnosis not present

## 2015-11-23 DIAGNOSIS — Z9889 Other specified postprocedural states: Secondary | ICD-10-CM | POA: Diagnosis not present

## 2015-11-23 DIAGNOSIS — Z79899 Other long term (current) drug therapy: Secondary | ICD-10-CM | POA: Diagnosis not present

## 2015-11-23 DIAGNOSIS — Z7982 Long term (current) use of aspirin: Secondary | ICD-10-CM | POA: Diagnosis not present

## 2015-11-23 DIAGNOSIS — G309 Alzheimer's disease, unspecified: Secondary | ICD-10-CM | POA: Insufficient documentation

## 2015-11-23 DIAGNOSIS — F0391 Unspecified dementia with behavioral disturbance: Secondary | ICD-10-CM
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
## 2015-11-24 LAB — CBC WITH DIFFERENTIAL/PLATELET
BASOS ABS: 0.1 10*3/uL (ref 0.0–0.1)
BASOS PCT: 1 %
EOS ABS: 0.2 10*3/uL (ref 0.0–0.7)
EOS PCT: 4 %
HCT: 37.5 % (ref 36.0–46.0)
Hemoglobin: 12.3 g/dL (ref 12.0–15.0)
Lymphocytes Relative: 35 %
Lymphs Abs: 1.9 10*3/uL (ref 0.7–4.0)
MCH: 30.9 pg (ref 26.0–34.0)
MCHC: 32.8 g/dL (ref 30.0–36.0)
MCV: 94.2 fL (ref 78.0–100.0)
MONO ABS: 0.5 10*3/uL (ref 0.1–1.0)
Monocytes Relative: 10 %
Neutro Abs: 2.7 10*3/uL (ref 1.7–7.7)
Neutrophils Relative %: 50 %
PLATELETS: 296 10*3/uL (ref 150–400)
RBC: 3.98 MIL/uL (ref 3.87–5.11)
RDW: 13.3 % (ref 11.5–15.5)
WBC: 5.5 10*3/uL (ref 4.0–10.5)

## 2015-11-24 LAB — COMPREHENSIVE METABOLIC PANEL
ALT: 37 U/L (ref 14–54)
ANION GAP: 10 (ref 5–15)
AST: 44 U/L — ABNORMAL HIGH (ref 15–41)
Albumin: 3.3 g/dL — ABNORMAL LOW (ref 3.5–5.0)
Alkaline Phosphatase: 81 U/L (ref 38–126)
BILIRUBIN TOTAL: 0.4 mg/dL (ref 0.3–1.2)
BUN: 25 mg/dL — ABNORMAL HIGH (ref 6–20)
CO2: 29 mmol/L (ref 22–32)
Calcium: 8.9 mg/dL (ref 8.9–10.3)
Chloride: 102 mmol/L (ref 101–111)
Creatinine, Ser: 0.67 mg/dL (ref 0.44–1.00)
Glucose, Bld: 103 mg/dL — ABNORMAL HIGH (ref 65–99)
POTASSIUM: 3.8 mmol/L (ref 3.5–5.1)
Sodium: 141 mmol/L (ref 135–145)
TOTAL PROTEIN: 6.1 g/dL — AB (ref 6.5–8.1)

## 2015-11-24 LAB — URINALYSIS, ROUTINE W REFLEX MICROSCOPIC
Bilirubin Urine: NEGATIVE
Glucose, UA: NEGATIVE mg/dL
HGB URINE DIPSTICK: NEGATIVE
Ketones, ur: NEGATIVE mg/dL
LEUKOCYTES UA: NEGATIVE
Nitrite: NEGATIVE
PROTEIN: NEGATIVE mg/dL
Specific Gravity, Urine: 1.021 (ref 1.005–1.030)
pH: 7 (ref 5.0–8.0)

## 2015-11-24 LAB — VALPROIC ACID LEVEL: VALPROIC ACID LVL: 47 ug/mL — AB (ref 50.0–100.0)

## 2015-11-24 NOTE — ED Notes (Addendum)
Patient laying quietly in bed. In no acute distress.

## 2015-11-24 NOTE — ED Notes (Signed)
Discharge instructions reviewed with patient and patient's daughter. Patient's daughter verbalized understanding.

## 2015-11-24 NOTE — ED Notes (Signed)
Per Bebe ShaggyWickline, MD, we should not in and out cath patient if urine sample cannot be obtained.
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
## 2015-11-24 NOTE — Discharge Instructions (Signed)

## 2015-11-24 NOTE — ED Notes (Signed)
MD at bedside. 

## 2015-11-24 NOTE — ED Notes (Signed)
Daughter at bedside.

## 2016-01-09 ENCOUNTER — Inpatient Hospital Stay (HOSPITAL_COMMUNITY)
Admission: EM | Admit: 2016-01-09 | Discharge: 2016-01-17 | DRG: 417 | Disposition: A | Discharge status: Skilled Nursing Facility | Payer: Medicare Other | Attending: Internal Medicine | Admitting: Internal Medicine

## 2016-01-09 ENCOUNTER — Emergency Department (HOSPITAL_COMMUNITY): Payer: Medicare Other

## 2016-01-09 ENCOUNTER — Encounter (HOSPITAL_COMMUNITY): Payer: Self-pay | Admitting: *Deleted

## 2016-01-09 DIAGNOSIS — R109 Unspecified abdominal pain: Secondary | ICD-10-CM | POA: Diagnosis present

## 2016-01-09 DIAGNOSIS — Z79899 Other long term (current) drug therapy: Secondary | ICD-10-CM | POA: Diagnosis not present

## 2016-01-09 DIAGNOSIS — Z7984 Long term (current) use of oral hypoglycemic drugs: Secondary | ICD-10-CM

## 2016-01-09 DIAGNOSIS — Z8249 Family history of ischemic heart disease and other diseases of the circulatory system: Secondary | ICD-10-CM | POA: Diagnosis not present

## 2016-01-09 DIAGNOSIS — K8000 Calculus of gallbladder with acute cholecystitis without obstruction: Principal | ICD-10-CM | POA: Diagnosis present

## 2016-01-09 DIAGNOSIS — Z87891 Personal history of nicotine dependence: Secondary | ICD-10-CM

## 2016-01-09 DIAGNOSIS — E876 Hypokalemia: Secondary | ICD-10-CM | POA: Diagnosis present

## 2016-01-09 DIAGNOSIS — I10 Essential (primary) hypertension: Secondary | ICD-10-CM | POA: Diagnosis present

## 2016-01-09 DIAGNOSIS — K819 Cholecystitis, unspecified: Secondary | ICD-10-CM | POA: Diagnosis present

## 2016-01-09 DIAGNOSIS — F419 Anxiety disorder, unspecified: Secondary | ICD-10-CM | POA: Diagnosis present

## 2016-01-09 DIAGNOSIS — Z66 Do not resuscitate: Secondary | ICD-10-CM | POA: Diagnosis present

## 2016-01-09 DIAGNOSIS — Z419 Encounter for procedure for purposes other than remedying health state, unspecified: Secondary | ICD-10-CM

## 2016-01-09 DIAGNOSIS — F0281 Dementia in other diseases classified elsewhere with behavioral disturbance: Secondary | ICD-10-CM | POA: Diagnosis not present

## 2016-01-09 DIAGNOSIS — F0280 Dementia in other diseases classified elsewhere without behavioral disturbance: Secondary | ICD-10-CM

## 2016-01-09 DIAGNOSIS — R059 Cough, unspecified: Secondary | ICD-10-CM

## 2016-01-09 DIAGNOSIS — E782 Mixed hyperlipidemia: Secondary | ICD-10-CM | POA: Diagnosis present

## 2016-01-09 DIAGNOSIS — E118 Type 2 diabetes mellitus with unspecified complications: Secondary | ICD-10-CM

## 2016-01-09 DIAGNOSIS — G308 Other Alzheimer's disease: Secondary | ICD-10-CM | POA: Diagnosis not present

## 2016-01-09 DIAGNOSIS — K810 Acute cholecystitis: Secondary | ICD-10-CM

## 2016-01-09 DIAGNOSIS — E119 Type 2 diabetes mellitus without complications: Secondary | ICD-10-CM | POA: Diagnosis present

## 2016-01-09 DIAGNOSIS — Z7982 Long term (current) use of aspirin: Secondary | ICD-10-CM

## 2016-01-09 DIAGNOSIS — G309 Alzheimer's disease, unspecified: Secondary | ICD-10-CM | POA: Diagnosis present

## 2016-01-09 DIAGNOSIS — F329 Major depressive disorder, single episode, unspecified: Secondary | ICD-10-CM | POA: Diagnosis present

## 2016-01-09 DIAGNOSIS — K851 Biliary acute pancreatitis without necrosis or infection: Secondary | ICD-10-CM

## 2016-01-09 DIAGNOSIS — R05 Cough: Secondary | ICD-10-CM

## 2016-01-09 LAB — COMPREHENSIVE METABOLIC PANEL
ALBUMIN: 3.9 g/dL (ref 3.5–5.0)
ALT: 49 U/L (ref 14–54)
AST: 113 U/L — AB (ref 15–41)
Alkaline Phosphatase: 93 U/L (ref 38–126)
Anion gap: 9 (ref 5–15)
BILIRUBIN TOTAL: 0.5 mg/dL (ref 0.3–1.2)
BUN: 17 mg/dL (ref 6–20)
CHLORIDE: 101 mmol/L (ref 101–111)
CO2: 27 mmol/L (ref 22–32)
CREATININE: 0.71 mg/dL (ref 0.44–1.00)
Calcium: 9 mg/dL (ref 8.9–10.3)
GFR calc Af Amer: >60 mL/min (ref >60)
GLUCOSE: 149 mg/dL — AB (ref 65–99)
POTASSIUM: 3.4 mmol/L — AB (ref 3.5–5.1)
Sodium: 137 mmol/L (ref 135–145)
TOTAL PROTEIN: 6.7 g/dL (ref 6.5–8.1)

## 2016-01-09 LAB — URINALYSIS, ROUTINE W REFLEX MICROSCOPIC
Bilirubin Urine: NEGATIVE
GLUCOSE, UA: NEGATIVE mg/dL
HGB URINE DIPSTICK: NEGATIVE
Ketones, ur: NEGATIVE mg/dL
LEUKOCYTES UA: NEGATIVE
Nitrite: NEGATIVE
PH: 7 (ref 5.0–8.0)
PROTEIN: NEGATIVE mg/dL
Specific Gravity, Urine: 1.016 (ref 1.005–1.030)

## 2016-01-09 LAB — CBC WITH DIFFERENTIAL/PLATELET
BASOS ABS: 0 10*3/uL (ref 0.0–0.1)
BASOS PCT: 0 %
Eosinophils Absolute: 0.1 10*3/uL (ref 0.0–0.7)
Eosinophils Relative: 1 %
HEMATOCRIT: 37.6 % (ref 36.0–46.0)
Hemoglobin: 12.1 g/dL (ref 12.0–15.0)
LYMPHS PCT: 11 %
Lymphs Abs: 1.3 10*3/uL (ref 0.7–4.0)
MCH: 30.8 pg (ref 26.0–34.0)
MCHC: 32.2 g/dL (ref 30.0–36.0)
MCV: 95.7 fL (ref 78.0–100.0)
Monocytes Absolute: 1.2 10*3/uL — ABNORMAL HIGH (ref 0.1–1.0)
Monocytes Relative: 10 %
NEUTROS ABS: 9.5 10*3/uL — AB (ref 1.7–7.7)
NEUTROS PCT: 78 %
Platelets: 349 10*3/uL (ref 150–400)
RBC: 3.93 MIL/uL (ref 3.87–5.11)
RDW: 13.4 % (ref 11.5–15.5)
WBC: 12.2 10*3/uL — AB (ref 4.0–10.5)

## 2016-01-09 LAB — LIPASE, BLOOD

## 2016-01-09 MED ORDER — IOHEXOL 300 MG/ML  SOLN
25.0000 mL | Freq: Once | INTRAMUSCULAR | Status: AC | PRN
  Administered 2016-01-09: 25 mL via ORAL

## 2016-01-09 MED ORDER — SODIUM CHLORIDE 0.9 % IV BOLUS (SEPSIS)
1000.0000 mL | Freq: Once | INTRAVENOUS | Status: AC
  Administered 2016-01-09: 1000 mL via INTRAVENOUS

## 2016-01-09 MED ORDER — IOHEXOL 300 MG/ML  SOLN
100.0000 mL | Freq: Once | INTRAMUSCULAR | Status: AC | PRN
  Administered 2016-01-09: 500 mL via INTRAVENOUS

## 2016-01-09 NOTE — ED Provider Notes (Signed)
CSN: [id number]     Arrival date & time 01/09/16  1717 History   First MD Initiated Contact with Patient 01/09/16 1724     Chief Complaint  Patient presents with  . Abdominal Pain     (Consider location/radiation/quality/duration/timing/severity/associated sxs/prior Treatment) Patient is a 75 y.o. female presenting with abdominal pain. The history is provided by the patient and medical records.  Abdominal Pain    LEVEL V CAVEAT:  DEMENTIA 75 year old female with hx of HTN, HLP, diabetes, anxiety, depression, Alzheimer's dementia, presenting to the ED from [facility] memory [facility] for abdominal pain.  Patient's daughter at bedside is assisting with history.  She was notified today that patient has been complaining of abdominal pain for about 2 days now.  She has apparently continued eating and drinking normally. Denies any vomiting or diarrhea. No noted fever or chills. Patient does have history of diverticulosis. Prior abdominal surgeries include tubal ligation. No meds given at facility.  Patient did become combative en route here, daughter states this is normal when she is in a new environment around people she doesn't know.  Patient currently calm and cooperative during my exam.   Past Medical History  Diagnosis Date  . Type 2 diabetes mellitus (HCC)   . Hypercholesterolemia with hyperglyceridemia   . HTN (hypertension)   . Chest pain     adenosine myoview (10/10) showed EF 72% and ischemia in the apical anterior wall and true apex. LHC (11/10): EF 60%, LVEDP 11, separate ostia for LAD and CFX, mild luminal irregularities in the LAD, no obstructive disease  . Anxiety   . Depression   . Dementia of the Alzheimer's type   . Alzheimer's disease 02/22/2015   Past Surgical History  Procedure Laterality Date  . Colonoscopy      07/02/2010  . Cardiac catheterization  2010    negative  . Tubal ligation    . Cataract extraction Bilateral    Family History  Problem Relation  Age of Onset  . Coronary artery disease      No premature  . Cancer Mother 62    breast  . Hypertension Father   . Stroke Father   . Hypertension Brother   . Heart disease Brother   . Cancer Sister     breast  . Dementia Paternal Aunt   . Dementia Paternal Aunt   . Dementia Paternal Aunt    Social History  Substance Use Topics  . Smoking status: Former Smoker -- 5 years    Quit date: 10/20/1987  . Smokeless tobacco: Never Used  . Alcohol Use: No   OB History    No data available     Review of Systems  Unable to perform ROS: Dementia      Allergies  Latex; Lipitor; and Shellfish-derived products  Home Medications   Prior to Admission medications   Medication Sig Start Date End Date Taking? Authorizing Provider  aspirin 81 MG tablet Take 81 mg by mouth daily.      Historical Provider, MD  BAYER CONTOUR TEST test strip USE TO CHECK GLUCOSE EVERY DAY 08/23/14   Lucky Cowboy, MD  cetirizine (ZYRTEC) 10 MG tablet Take 10 mg by mouth daily as needed for allergies.    Historical Provider, MD  cholecalciferol (VITAMIN D) 1000 UNITS tablet Take 1,000 Units by mouth daily.    Historical Provider, MD  divalproex (DEPAKOTE ER) 250 MG 24 hr tablet Take 250 mg by mouth 2 (two) times daily. Given with 500MG   08/19/15   Historical Provider, MD  divalproex (DEPAKOTE ER) 500 MG 24 hr tablet Take 500 mg by mouth 2 (two) times daily. Given with     Historical Provider, MD  enalapril (VASOTEC) 20 MG tablet TAKE 1 TABLET BY MOUTH EVERY MORNING 09/07/14   Lucky Cowboy, MD  fenofibrate micronized (LOFIBRA) 134 MG capsule TAKE ONE CAPSULE BY MOUTH DAILY 10/08/14   Lucky Cowboy, MD  LORazepam (ATIVAN) 0.5 MG tablet Take 0.5 mg by mouth 2 (two) times daily.  08/22/15   Historical Provider, MD  Magnesium Oxide 250 MG TABS Take 1 tablet by mouth daily.    Historical Provider, MD  memantine (NAMENDA XR) 14 MG CP24 24 hr capsule Take 14 mg by mouth daily.    Historical Provider, MD   Memantine HCl ER (NAMENDA XR) 28 MG CP24 Take 28 mg by mouth every evening. At 5pm Patient not taking: Reported on 11/23/2015 03/21/14   Lucky Cowboy, MD  metFORMIN (GLUCOPHAGE) 500 MG tablet Take 250-500 mg by mouth 2 (two) times daily with a meal. Take  in the morning and  in the evening    Historical Provider, MD  metFORMIN (GLUCOPHAGE-XR) 500 MG 24 hr tablet Take 1 tablet (500 mg total) by mouth daily with breakfast. Patient not taking: Reported on 11/23/2015 09/19/14   Quentin Mulling, PA-C  mirtazapine (REMERON) 15 MG tablet Take 15 mg by mouth at bedtime.    Historical Provider, MD  risperiDONE (RISPERDAL) 0.25 MG tablet Take 0.25 mg by mouth at bedtime.    Historical Provider, MD  rivastigmine (EXELON) 9.5 mg/24hr Place 4.6 mg onto the skin daily.     Historical Provider, MD   BP 137/74 mmHg  Pulse 99  Temp(Src) 98.2 F (36.8 C) (Oral)  Resp 18  SpO2 97%   Physical Exam  Constitutional: She is oriented to person, place, and time. She appears well-developed and well-nourished.  Calm, cooperative, NAD  HENT:  Head: Normocephalic and atraumatic.  Mouth/Throat: Oropharynx is clear and moist.  Eyes: Conjunctivae and EOM are normal. Pupils are equal, round, and reactive to light.  Neck: Normal range of motion.  Cardiovascular: Normal rate, regular rhythm and normal heart sounds.   Pulmonary/Chest: Effort normal and breath sounds normal.  Abdominal: Soft. Bowel sounds are normal. There is tenderness in the right upper quadrant, epigastric area and periumbilical area. There is no rigidity, no guarding and no CVA tenderness.  Tenderness in RUQ, epigastric, and peri-umbilical regions; no guarding, no rebound  Musculoskeletal: Normal range of motion.  Neurological: She is alert and oriented to person, place, and time.  Skin: Skin is warm and dry.  Psychiatric: She has a normal mood and affect.  Nursing note and vitals reviewed.   ED Course  Procedures (including critical  care time) Labs Review Labs Reviewed  CBC WITH DIFFERENTIAL/PLATELET - Abnormal; Notable for the following:    WBC 12.2 (*)    Neutro Abs 9.5 (*)    Monocytes Absolute 1.2 (*)    All other components within normal limits  COMPREHENSIVE METABOLIC PANEL - Abnormal; Notable for the following:    Potassium 3.4 (*)    Glucose, Bld 149 (*)    AST 113 (*)    All other components within normal limits  LIPASE, BLOOD - Abnormal; Notable for the following:    Lipase >3000 (*)    All other components within normal limits  URINE CULTURE  URINALYSIS, ROUTINE W REFLEX MICROSCOPIC (NOT AT [facility])    Imaging Review Ct  Abdomen Pelvis W Contrast  01/09/2016  CLINICAL DATA:  Abdominal pain EXAM: CT ABDOMEN AND PELVIS WITH CONTRAST TECHNIQUE: Multidetector CT imaging of the abdomen and pelvis was performed using the standard protocol following bolus administration of intravenous contrast. CONTRAST:  OMNIPAQUE IOHEXOL 300 MG/ML SOLN, 25mL OMNIPAQUE IOHEXOL 300 MG/ML SOLN COMPARISON:  None. FINDINGS: The gallbladder is distended. There is wall thickening and wall edema. No obvious gallstones are present. Liver, spleen, pancreas, adrenal glands are within normal limits. Kidneys are within normal limits. Normal appendix. The uterus is markedly lobulated. A left pelvic pedunculated fibroid is suspected. Unremarkable bladder. Small amount of free fluid is nonspecific in the right hemipelvis and about the liver. Sigmoid diverticulosis. There is suspected to be a 2.0 cm lipoma in the distal descending colon. There is no disproportionate dilatation of proximal colon to suggest obstruction. Lower abdominal ventral hernia contains adipose tissue. Advanced degenerative disc disease at L5-S1. Less prominent degenerative disc disease in the remainder of the lumbar spine levels. Atherosclerotic calcifications of the aorta and iliac vasculature. IMPRESSION: Gallbladder is distended with wall edema. Acalculous cholecystitis is  not excluded. Ultrasound may be helpful. Small amount of free fluid is nonspecific. Small lipoma in the descending colon without evidence of bowel obstruction. Ventral hernia containing adipose tissue. Degenerative disc disease in lumbar spine. Electronically Signed   By: Jolaine Click M.D.   On: 01/09/2016 20:26   I have personally reviewed and evaluated these images and lab results as part of my medical decision-making.   EKG Interpretation None      MDM   Final diagnoses:  Acute cholecystitis  Alzheimer's dementia  Gallstone pancreatitis   75 year old female here with abdominal pain. Patient is afebrile, nontoxic. She's not had any nausea, vomiting, or diarrhea. Her vital signs are stable. She does have tenderness in right upper quadrant, epigastric, and periumbilical regions. She has no peritonitis on exam, abdomen remains soft.  Will plan for labs, CT.    Labs with significantly elevated lipase at >3000.  Ironically, patient has not required any pain medication here. She only has pain with palpation of her abdomen, has been resting comfortably. She was given IV fluids. CT scan with question of acalculous cholecystitis as gallbladder is distended with wall edema, recommended ultrasound. Right upper quadrant ultrasound was obtained which shows acute cholecystitis.  10:40 PM Case discussed with general surgery, Dr. Daphine Deutscher-- will need lipase better controlled prior to any surgical intervention.  He will follow along, decision regarding abx deferred to medicine team. Patient will need medical admit given her cormorbidities.  Patient will be admitted to medicine service, Dr. Katrinka Blazing.  Will hold on abx for now given patient remains afebrile without clinical signs of sepsis, may need to be given pre-operatively.  Will continue fluids, keep NPO for bowel rest.  Temp admit orders placed.  Garlon Hatchet, PA-C 01/10/16 0032  Alvira Monday, MD 01/10/16 [phone]

## 2016-01-09 NOTE — ED Notes (Signed)
Ultrasound at bedside

## 2016-01-09 NOTE — ED Notes (Signed)
Per EMS, pt from [facility].  Pt was combative and confused per EMS.  Pt was given Versed 1.25mg  and Haldol  IM en route.  Per staff, pt c/o abd pain, ? Umbilical hernia.  Per staff, this is normal.  Pt has dementia.

## 2016-01-10 ENCOUNTER — Encounter (HOSPITAL_COMMUNITY): Payer: Self-pay

## 2016-01-10 DIAGNOSIS — I10 Essential (primary) hypertension: Secondary | ICD-10-CM

## 2016-01-10 DIAGNOSIS — K851 Biliary acute pancreatitis without necrosis or infection: Secondary | ICD-10-CM | POA: Diagnosis present

## 2016-01-10 DIAGNOSIS — R109 Unspecified abdominal pain: Secondary | ICD-10-CM | POA: Diagnosis present

## 2016-01-10 DIAGNOSIS — E118 Type 2 diabetes mellitus with unspecified complications: Secondary | ICD-10-CM

## 2016-01-10 DIAGNOSIS — E782 Mixed hyperlipidemia: Secondary | ICD-10-CM

## 2016-01-10 LAB — COMPREHENSIVE METABOLIC PANEL
ALBUMIN: 3.7 g/dL (ref 3.5–5.0)
ALK PHOS: 95 U/L (ref 38–126)
ALT: 68 U/L — ABNORMAL HIGH (ref 14–54)
ANION GAP: 10 (ref 5–15)
AST: 79 U/L — ABNORMAL HIGH (ref 15–41)
BUN: 13 mg/dL (ref 6–20)
CALCIUM: 8.8 mg/dL — AB (ref 8.9–10.3)
CHLORIDE: 106 mmol/L (ref 101–111)
CO2: 25 mmol/L (ref 22–32)
Creatinine, Ser: 0.63 mg/dL (ref 0.44–1.00)
GFR calc non Af Amer: >60 mL/min (ref >60)
GLUCOSE: 122 mg/dL — AB (ref 65–99)
Potassium: 3.7 mmol/L (ref 3.5–5.1)
SODIUM: 141 mmol/L (ref 135–145)
Total Bilirubin: 0.7 mg/dL (ref 0.3–1.2)
Total Protein: 6 g/dL — ABNORMAL LOW (ref 6.5–8.1)

## 2016-01-10 LAB — CBG MONITORING, ED
GLUCOSE-CAPILLARY: 138 mg/dL — AB (ref 65–99)
GLUCOSE-CAPILLARY: 116 mg/dL — AB (ref 65–99)
Glucose-Capillary: 97 mg/dL (ref 65–99)

## 2016-01-10 LAB — GLUCOSE, CAPILLARY
GLUCOSE-CAPILLARY: 108 mg/dL — AB (ref 65–99)
Glucose-Capillary: 98 mg/dL (ref 65–99)

## 2016-01-10 LAB — CBC
HCT: 37.6 % (ref 36.0–46.0)
HEMOGLOBIN: 12.3 g/dL (ref 12.0–15.0)
MCH: 31.1 pg (ref 26.0–34.0)
MCHC: 32.7 g/dL (ref 30.0–36.0)
MCV: 94.9 fL (ref 78.0–100.0)
PLATELETS: 319 10*3/uL (ref 150–400)
RBC: 3.96 MIL/uL (ref 3.87–5.11)
RDW: 13.4 % (ref 11.5–15.5)
WBC: 8 10*3/uL (ref 4.0–10.5)

## 2016-01-10 LAB — LIPASE, BLOOD: Lipase: 275 U/L — ABNORMAL HIGH (ref 11–51)

## 2016-01-10 MED ORDER — ALBUTEROL SULFATE (2.5 MG/3ML) 0.083% IN NEBU: 2.5000 mg | INHALATION_SOLUTION | RESPIRATORY_TRACT | Status: DC | PRN

## 2016-01-10 MED ORDER — INSULIN ASPART 100 UNIT/ML ~~LOC~~ SOLN
0.0000 [IU] | Freq: Three times a day (TID) | SUBCUTANEOUS | Status: DC
  Administered 2016-01-13 – 2016-01-14 (×2): 1 [IU] via SUBCUTANEOUS
  Administered 2016-01-15: 2 [IU] via SUBCUTANEOUS
  Administered 2016-01-16: 1 [IU] via SUBCUTANEOUS
  Administered 2016-01-16 – 2016-01-17 (×2): 2 [IU] via SUBCUTANEOUS

## 2016-01-10 MED ORDER — RIVASTIGMINE 4.6 MG/24HR TD PT24
4.6000 mg | MEDICATED_PATCH | Freq: Every day | TRANSDERMAL | Status: DC
  Administered 2016-01-10 – 2016-01-15 (×6): 4.6 mg via TRANSDERMAL
  Filled 2016-01-10 (×6): qty 1

## 2016-01-10 MED ORDER — ENALAPRIL MALEATE 20 MG PO TABS
20.0000 mg | ORAL_TABLET | Freq: Every morning | ORAL | Status: DC
  Administered 2016-01-10 – 2016-01-17 (×7): 20 mg via ORAL
  Filled 2016-01-10 (×8): qty 1

## 2016-01-10 MED ORDER — ACETAMINOPHEN 325 MG PO TABS
650.0000 mg | ORAL_TABLET | ORAL | Status: DC | PRN
  Administered 2016-01-11: 650 mg via ORAL
  Filled 2016-01-10 (×2): qty 2

## 2016-01-10 MED ORDER — PANTOPRAZOLE SODIUM 40 MG IV SOLR
40.0000 mg | INTRAVENOUS | Status: DC
  Administered 2016-01-10 – 2016-01-13 (×4): 40 mg via INTRAVENOUS
  Filled 2016-01-10 (×5): qty 40

## 2016-01-10 MED ORDER — MEMANTINE HCL ER 7 MG PO CP24
21.0000 mg | ORAL_CAPSULE | Freq: Every day | ORAL | Status: DC
  Administered 2016-01-10 – 2016-01-17 (×8): 21 mg via ORAL
  Filled 2016-01-10 (×8): qty 3

## 2016-01-10 MED ORDER — SODIUM CHLORIDE 0.9 % IV SOLN
INTRAVENOUS | Status: AC
  Administered 2016-01-10 – 2016-01-12 (×4): via INTRAVENOUS

## 2016-01-10 MED ORDER — MIRTAZAPINE 7.5 MG PO TABS
22.5000 mg | ORAL_TABLET | Freq: Every day | ORAL | Status: DC
  Administered 2016-01-10 – 2016-01-14 (×6): 22.5 mg via ORAL
  Filled 2016-01-10 (×8): qty 1

## 2016-01-10 MED ORDER — ONDANSETRON HCL 4 MG PO TABS: 4.0000 mg | ORAL_TABLET | Freq: Four times a day (QID) | ORAL | Status: DC | PRN

## 2016-01-10 MED ORDER — PIPERACILLIN-TAZOBACTAM 3.375 G IVPB
3.3750 g | Freq: Three times a day (TID) | INTRAVENOUS | Status: DC
  Administered 2016-01-10 – 2016-01-11 (×4): 3.375 g via INTRAVENOUS
  Filled 2016-01-10 (×6): qty 50

## 2016-01-10 MED ORDER — PIPERACILLIN-TAZOBACTAM 3.375 G IVPB: 3.3750 g | Freq: Three times a day (TID) | INTRAVENOUS | Status: DC

## 2016-01-10 MED ORDER — ALPRAZOLAM 0.25 MG PO TABS
0.2500 mg | ORAL_TABLET | ORAL | Status: DC | PRN
  Administered 2016-01-10 – 2016-01-17 (×14): 0.25 mg via ORAL
  Filled 2016-01-10 (×15): qty 1

## 2016-01-10 MED ORDER — QUETIAPINE FUMARATE 50 MG PO TABS
50.0000 mg | ORAL_TABLET | Freq: Every day | ORAL | Status: DC
  Administered 2016-01-10 – 2016-01-14 (×6): 50 mg via ORAL
  Filled 2016-01-10 (×8): qty 1

## 2016-01-10 MED ORDER — LORATADINE 10 MG PO TABS
10.0000 mg | ORAL_TABLET | Freq: Every day | ORAL | Status: DC
  Administered 2016-01-10 – 2016-01-15 (×6): 10 mg via ORAL
  Filled 2016-01-10 (×6): qty 1

## 2016-01-10 MED ORDER — VITAMIN D3 25 MCG (1000 UNIT) PO TABS
1000.0000 [IU] | ORAL_TABLET | Freq: Every day | ORAL | Status: DC
  Administered 2016-01-10 – 2016-01-17 (×8): 1000 [IU] via ORAL
  Filled 2016-01-10 (×8): qty 1

## 2016-01-10 MED ORDER — ENOXAPARIN SODIUM 40 MG/0.4ML ~~LOC~~ SOLN
40.0000 mg | Freq: Every day | SUBCUTANEOUS | Status: DC
  Administered 2016-01-10 – 2016-01-15 (×5): 40 mg via SUBCUTANEOUS
  Filled 2016-01-10 (×10): qty 0.4

## 2016-01-10 MED ORDER — ONDANSETRON HCL 4 MG/2ML IJ SOLN: 4.0000 mg | Freq: Four times a day (QID) | INTRAMUSCULAR | Status: DC | PRN

## 2016-01-10 MED ORDER — MORPHINE SULFATE (PF) 2 MG/ML IV SOLN
1.0000 mg | INTRAVENOUS | Status: DC | PRN
  Administered 2016-01-10 – 2016-01-13 (×7): 1 mg via INTRAVENOUS
  Filled 2016-01-10 (×6): qty 1

## 2016-01-10 MED ORDER — ASPIRIN 81 MG PO CHEW
81.0000 mg | CHEWABLE_TABLET | Freq: Every day | ORAL | Status: DC
  Administered 2016-01-10 – 2016-01-17 (×8): 81 mg via ORAL
  Filled 2016-01-10 (×8): qty 1

## 2016-01-10 MED ORDER — MELATONIN 5 MG PO TABS: 10.0000 mg | ORAL_TABLET | Freq: Every day | ORAL | Status: DC

## 2016-01-10 NOTE — ED Notes (Signed)
Daughter helped ambulate pt to restroom.

## 2016-01-10 NOTE — ED Notes (Signed)
Sent pharmacy message regarding medications.

## 2016-01-10 NOTE — ED Notes (Signed)
MD at bedside. 

## 2016-01-10 NOTE — ED Notes (Signed)
Patient up out of bed trying to go to restroom. Able to direct to the restroom with 2 person assist without difficulty. Ambulated back to bed with steady gait, doesn't appear to understand very many commands. Will call daughter to determine patient's baseline confusion d/t hx of alzheimer's.

## 2016-01-10 NOTE — Progress Notes (Signed)
PHARMACIST - PHYSICIAN ORDER COMMUNICATION  CONCERNING: P&T Medication Policy on Herbal Medications  DESCRIPTION:  This patient's order for:  melatonin  has been noted.  This product(s) is classified as an "herbal" or natural product. Due to a lack of definitive safety studies or FDA approval, nonstandard manufacturing practices, plus the potential risk of unknown drug-drug interactions while on inpatient medications, the Pharmacy and Therapeutics Committee does not permit the use of "herbal" or natural products of this type within [facility].   ACTION TAKEN: The pharmacy department is unable to verify this order at this time and your patient has been informed of this safety policy. Please reevaluate patient's clinical condition at discharge and address if the herbal or natural product(s) should be resumed at that time.  Thanks Lorenza Evangelist 01/10/2016 1:53 AM

## 2016-01-10 NOTE — ED Notes (Signed)
Pt appeared agitated, trying to get out of bed. Will give PRN ativan dose for agitation with morning meds. Contacted patient's daughter who said she would return soon.

## 2016-01-10 NOTE — ED Notes (Signed)
Amy [name] Daughter's cell [phone]

## 2016-01-10 NOTE — Consult Note (Signed)
Reason for Consult:  Gallstone pancreatitis Referring Physician: Dr. Vella Raring PCP:  Kelton Pillar, Herschell Dimes, MD  CC:  Abdominal pain  Amy [name] is an 75 y.o. female.  HPI: Pt from SNF with severe Alzheimer's dementia complaining of abdominal pain.  Pt daughter is main source of information and reported pain mostly in the RUQ.  She is currently without any family members so history is all from the chart.  Patient cannot even remember where she was having pain. Work up in the ED: afebrile, with VSS. Labs show lipase >3000.  WBC was up some but is back down to 8 K this AM.  LFT's are normal.  CT of the abdomen last PM shows the gallbladder is distended.  There is wall thickening and edema, no stones were seen on the CT,but they were  concerned with acalculous cholecystitis. [place] was then obtained which showed:  Small mobile echogenic foci consistent with cholelithiasis. The gallbladder wall is thickened measuring up to 5 mm. There is a small amount of pericholecystic fluid. Per the sonographer, the sonographic Percell Miller sign is negative.  Common bile duct:  Diameter: Within normal limits at 6- 7 mm.  Again consistent with acute cholecystitis.  We are ask to see.  Past Medical History  Diagnosis Date  . Type 2 diabetes mellitus ([place])   . Hypercholesterolemia with hyperglyceridemia   . HTN (hypertension)   . Chest pain     adenosine myoview (10/10) showed EF 72% and ischemia in the apical anterior wall and true apex. LHC (11/10): EF 60%, LVEDP 11, separate ostia for LAD and CFX, mild luminal irregularities in the LAD, no obstructive disease  . Anxiety   . Depression   . Dementia of the Alzheimer's type   . Alzheimer's disease 02/22/2015    Past Surgical History  Procedure Laterality Date  . Colonoscopy      07/02/2010  . Cardiac catheterization  2010    negative  . Tubal ligation    . Cataract extraction Bilateral     Family History  Problem Relation Age of Onset  . Coronary artery  disease      No premature  . Cancer Mother 52    breast  . Hypertension Father   . Stroke Father   . Hypertension Brother   . Heart disease Brother   . Cancer Sister     breast  . Dementia Paternal Aunt   . Dementia Paternal Aunt   . Dementia Paternal Aunt     Social History:  reports that she quit smoking about 28 years ago. She has never used smokeless tobacco. She reports that she does not drink alcohol or use illicit drugs.  Allergies:  Allergies  Allergen Reactions  . Latex Other (See Comments)    MAR  . Lipitor [Atorvastatin] Other (See Comments)    MAR  . Shellfish-Derived Products Other (See Comments)    MAR    Medications:  Prior to Admission medications   Medication Sig Start Date End Date Taking? Authorizing Provider  acetaminophen (TYLENOL) 325 MG tablet Take 650 mg by mouth every 4 (four) hours as needed for moderate pain.   Yes Historical Provider, MD  ALPRAZolam (XANAX) 0.25 MG tablet Take 0.25 mg by mouth every 4 (four) hours as needed for anxiety.   Yes Historical Provider, MD  aspirin 81 MG tablet Take 81 mg by mouth daily.     Yes Historical Provider, MD  busPIRone (BUSPAR) 10 MG tablet Take 10 mg by mouth  3 (three) times daily.   Yes Historical Provider, MD  cholecalciferol (VITAMIN D) 1000 UNITS tablet Take 1,000 Units by mouth daily.   Yes Historical Provider, MD  enalapril (VASOTEC) 20 MG tablet TAKE 1 TABLET BY MOUTH EVERY MORNING 09/07/14  Yes Unk Pinto, MD  levofloxacin (LEVAQUIN) 500 MG tablet Take 500 mg by mouth daily. 7 Day Course. ABT Start Date 01/05/16 & End Date 01/11/16.   Yes Historical Provider, MD  Melatonin 5 MG TABS Take 10 mg by mouth at bedtime.   Yes Historical Provider, MD  Memantine HCl ER (NAMENDA XR) 21 MG CP24 Take 1 tablet by mouth daily.   Yes Historical Provider, MD  metFORMIN (GLUCOPHAGE) 500 MG tablet Take 500 mg by mouth daily.    Yes Historical Provider, MD  mirtazapine (REMERON) 30 MG tablet Take 30 mg by mouth at  bedtime.   Yes Historical Provider, MD  QUEtiapine (SEROQUEL) 50 MG tablet Take 50 mg by mouth at bedtime.   Yes Historical Provider, MD  rivastigmine (EXELON) 4.6 mg/24hr Place 4.6 mg onto the skin daily.   Yes Historical Provider, MD  saccharomyces boulardii (FLORASTOR) 250 MG capsule Take 250 mg by mouth daily. 14 Day Course.   Yes Historical Provider, MD  sertraline (ZOLOFT) 25 MG tablet Take 25 mg by mouth daily.   Yes Historical Provider, MD  BAYER CONTOUR TEST test strip USE TO CHECK GLUCOSE EVERY DAY 08/23/14   Unk Pinto, MD  cetirizine (ZYRTEC) 10 MG tablet Take 10 mg by mouth daily as needed for allergies.    Historical Provider, MD  fenofibrate micronized (LOFIBRA) 134 MG capsule TAKE ONE CAPSULE BY MOUTH DAILY Patient not taking: Reported on 01/09/2016 10/08/14   Unk Pinto, MD  Memantine HCl ER (NAMENDA XR) 28 MG CP24 Take 28 mg by mouth every evening. At 5pm Patient not taking: Reported on 11/23/2015 03/21/14   Unk Pinto, MD  metFORMIN (GLUCOPHAGE-XR) 500 MG 24 hr tablet Take 1 tablet (500 mg total) by mouth daily with breakfast. Patient not taking: Reported on 11/23/2015 09/19/14   Vicie Mutters, PA-C     Scheduled: . aspirin  81 mg Oral Daily  . cholecalciferol  1,000 Units Oral Daily  . enalapril  20 mg Oral q morning - 10a  . enoxaparin (LOVENOX) injection  40 mg Subcutaneous QHS  . insulin aspart  0-9 Units Subcutaneous TID WC  . loratadine  10 mg Oral Daily  . memantine  21 mg Oral Daily  . mirtazapine  22.5 mg Oral QHS  . QUEtiapine  50 mg Oral QHS  . rivastigmine  4.6 mg Transdermal Daily   Continuous: . sodium chloride 100 mL/hr at 01/10/16 0122   TAV:[id number], albuterol, ALPRAZolam, morphine injection, ondansetron **OR** ondansetron (ZOFRAN) IV Anti-infectives    None      Results for orders placed or performed during the hospital encounter of 01/09/16 (from the past 48 hour(s))  Urinalysis, Routine w reflex microscopic (not at [facility])      Status: None   Collection Time: 01/09/16  6:34 PM  Result Value Ref Range   Color, Urine YELLOW YELLOW   APPearance CLEAR CLEAR   Specific Gravity, Urine 1.016 1.005 - 1.030   pH 7.0 5.0 - 8.0   Glucose, UA NEGATIVE NEGATIVE mg/dL   Hgb urine dipstick NEGATIVE NEGATIVE   Bilirubin Urine NEGATIVE NEGATIVE   Ketones, ur NEGATIVE NEGATIVE mg/dL   Protein, ur NEGATIVE NEGATIVE mg/dL   Nitrite NEGATIVE NEGATIVE   Leukocytes, UA NEGATIVE NEGATIVE  Comment: MICROSCOPIC NOT DONE ON URINES WITH NEGATIVE PROTEIN, BLOOD, LEUKOCYTES, NITRITE, OR GLUCOSE <1000 mg/dL.  CBC with Differential     Status: Abnormal   Collection Time: 01/09/16  6:40 PM  Result Value Ref Range   WBC 12.2 (H) 4.0 - 10.5 K/uL   RBC 3.93 3.87 - 5.11 MIL/uL   Hemoglobin 12.1 12.0 - 15.0 g/dL   HCT 37.6 36.0 - 46.0 %   MCV 95.7 78.0 - 100.0 fL   MCH 30.8 26.0 - 34.0 pg   MCHC 32.2 30.0 - 36.0 g/dL   RDW 13.4 11.5 - 15.5 %   Platelets 349 150 - 400 K/uL   Neutrophils Relative % 78 %   Neutro Abs 9.5 (H) 1.7 - 7.7 K/uL   Lymphocytes Relative 11 %   Lymphs Abs 1.3 0.7 - 4.0 K/uL   Monocytes Relative 10 %   Monocytes Absolute 1.2 (H) 0.1 - 1.0 K/uL   Eosinophils Relative 1 %   Eosinophils Absolute 0.1 0.0 - 0.7 K/uL   Basophils Relative 0 %   Basophils Absolute 0.0 0.0 - 0.1 K/uL  Comprehensive metabolic panel     Status: Abnormal   Collection Time: 01/09/16  6:40 PM  Result Value Ref Range   Sodium 137 135 - 145 mmol/L   Potassium 3.4 (L) 3.5 - 5.1 mmol/L   Chloride 101 101 - 111 mmol/L   CO2 27 22 - 32 mmol/L   Glucose, Bld 149 (H) 65 - 99 mg/dL   BUN 17 6 - 20 mg/dL   Creatinine, Ser 0.71 0.44 - 1.00 mg/dL   Calcium 9.0 8.9 - 10.3 mg/dL   Total Protein 6.7 6.5 - 8.1 g/dL   Albumin 3.9 3.5 - 5.0 g/dL   AST 113 (H) 15 - 41 U/L   ALT 49 14 - 54 U/L   Alkaline Phosphatase 93 38 - 126 U/L   Total Bilirubin 0.5 0.3 - 1.2 mg/dL   GFR calc non Af Amer >60 >60 mL/min   GFR calc Af Amer >60 >60 mL/min     Comment: (NOTE) The eGFR has been calculated using the CKD EPI equation. This calculation has not been validated in all clinical situations. eGFR's persistently <60 mL/min signify possible Chronic Kidney Disease.    Anion gap 9 5 - 15  Lipase, blood     Status: Abnormal   Collection Time: 01/09/16  6:40 PM  Result Value Ref Range   Lipase >3000 (H) 11 - 51 U/L    Comment: RESULTS CONFIRMED BY MANUAL DILUTION  CBG monitoring, ED     Status: Abnormal   Collection Time: 01/10/16  2:25 AM  Result Value Ref Range   Glucose-Capillary 138 (H) 65 - 99 mg/dL   Comment 1 Notify RN    Comment 2 Document in Chart   CBC     Status: None   Collection Time: 01/10/16  4:33 AM  Result Value Ref Range   WBC 8.0 4.0 - 10.5 K/uL   RBC 3.96 3.87 - 5.11 MIL/uL   Hemoglobin 12.3 12.0 - 15.0 g/dL   HCT 37.6 36.0 - 46.0 %   MCV 94.9 78.0 - 100.0 fL   MCH 31.1 26.0 - 34.0 pg   MCHC 32.7 30.0 - 36.0 g/dL   RDW 13.4 11.5 - 15.5 %   Platelets 319 150 - 400 K/uL  Comprehensive metabolic panel     Status: Abnormal   Collection Time: 01/10/16  4:33 AM  Result Value Ref Range  Sodium 141 135 - 145 mmol/L   Potassium 3.7 3.5 - 5.1 mmol/L   Chloride 106 101 - 111 mmol/L   CO2 25 22 - 32 mmol/L   Glucose, Bld 122 (H) 65 - 99 mg/dL   BUN 13 6 - 20 mg/dL   Creatinine, Ser 0.63 0.44 - 1.00 mg/dL   Calcium 8.8 (L) 8.9 - 10.3 mg/dL   Total Protein 6.0 (L) 6.5 - 8.1 g/dL   Albumin 3.7 3.5 - 5.0 g/dL   AST 79 (H) 15 - 41 U/L   ALT 68 (H) 14 - 54 U/L   Alkaline Phosphatase 95 38 - 126 U/L   Total Bilirubin 0.7 0.3 - 1.2 mg/dL   GFR calc non Af Amer >60 >60 mL/min   GFR calc Af Amer >60 >60 mL/min    Comment: (NOTE) The eGFR has been calculated using the CKD EPI equation. This calculation has not been validated in all clinical situations. eGFR's persistently <60 mL/min signify possible Chronic Kidney Disease.    Anion gap 10 5 - 15  CBG monitoring, ED     Status: Abnormal   Collection Time: 01/10/16   4:34 AM  Result Value Ref Range   Glucose-Capillary 116 (H) 65 - 99 mg/dL  CBG monitoring, ED     Status: None   Collection Time: 01/10/16  7:08 AM  Result Value Ref Range   Glucose-Capillary 97 65 - 99 mg/dL    Ct Abdomen Pelvis W Contrast  01/09/2016  CLINICAL DATA:  Abdominal pain EXAM: CT ABDOMEN AND PELVIS WITH CONTRAST TECHNIQUE: Multidetector CT imaging of the abdomen and pelvis was performed using the standard protocol following bolus administration of intravenous contrast. CONTRAST:  579m OMNIPAQUE IOHEXOL 300 MG/ML SOLN, 258mOMNIPAQUE IOHEXOL 300 MG/ML SOLN COMPARISON:  None. FINDINGS: The gallbladder is distended. There is wall thickening and wall edema. No obvious gallstones are present. Liver, spleen, pancreas, adrenal glands are within normal limits. Kidneys are within normal limits. Normal appendix. The uterus is markedly lobulated. A left pelvic pedunculated fibroid is suspected. Unremarkable bladder. Small amount of free fluid is nonspecific in the right hemipelvis and about the liver. Sigmoid diverticulosis. There is suspected to be a 2.0 cm lipoma in the distal descending colon. There is no disproportionate dilatation of proximal colon to suggest obstruction. Lower abdominal ventral hernia contains adipose tissue. Advanced degenerative disc disease at L5-S1. Less prominent degenerative disc disease in the remainder of the lumbar spine levels. Atherosclerotic calcifications of the aorta and iliac vasculature. IMPRESSION: Gallbladder is distended with wall edema. Acalculous cholecystitis is not excluded. Ultrasound may be helpful. Small amount of free fluid is nonspecific. Small lipoma in the descending colon without evidence of bowel obstruction. Ventral hernia containing adipose tissue. Degenerative disc disease in lumbar spine. Electronically Signed   By: ArMarybelle Killings.D.   On: 01/09/2016 20:26   Us[place]abdomen Limited  01/09/2016  CLINICAL DATA:  [age]ear old female with right upper  quadrant pain and distended gallbladder on CT scan. EXAM: [place]OMEN LIMITED - RIGHT UPPER QUADRANT COMPARISON:  CT scan of the abdomen and pelvis obtained earlier today FINDINGS: Gallbladder: Small mobile echogenic foci consistent with cholelithiasis. The gallbladder wall is thickened measuring up to 5 mm. There is a small amount of pericholecystic fluid. Per the sonographer, the sonographic MuPercell Millerign is negative. Common bile duct: Diameter: Within normal limits at 6- 7 mm. Liver: No focal lesion identified. Within normal limits in parenchymal echogenicity. IMPRESSION: Cholelithiasis with gallbladder wall thickening and trace  pericholecystic fluid. Sonographic findings can be consistent with acute cholecystitis in the appropriate clinical setting. Of note, the sonographic Percell Miller sign was reportedly negative. Electronically Signed   By: Jacqulynn Cadet M.D.   On: 01/09/2016 22:15    Review of Systems  Unable to perform ROS: dementia   Blood pressure 121/62, pulse 69, temperature 98.3 F (36.8 C), temperature source Oral, resp. rate 17, SpO2 97 %. Physical Exam  Constitutional: She appears well-developed and well-nourished.  Very anxious, doesn't remember anything much more than her name and her daughters name.  The nursing staff is the only ones with her currently.  HENT:  Head: Normocephalic and atraumatic.  Nose: Nose normal.  Eyes: Conjunctivae and EOM are normal. Right eye exhibits no discharge. Left eye exhibits no discharge. No scleral icterus.  Neck: Normal range of motion. Neck supple. No JVD present. No tracheal deviation present. No thyromegaly present.  Cardiovascular: Normal rate, regular rhythm, normal heart sounds and intact distal pulses.   No murmur heard. Respiratory: Effort normal and breath sounds normal. No respiratory distress. She has no wheezes. She has no rales. She exhibits no tenderness.  GI: Soft. She exhibits no distension and no mass. There is tenderness. There is  no rebound and no guarding.  Few BS, her pain is hard to pinpoint, but seems to be mid upper abdomen and right side, both upper and lower quadrants.  Musculoskeletal: She exhibits no edema or tenderness.  Lymphadenopathy:    She has no cervical adenopathy.  Neurological: She is alert. No cranial nerve deficit.  Skin: Skin is warm and dry. No rash noted. No erythema. No pallor.  Psychiatric:  She has significant dementia, she cannot remember where her pain is or was she can only react to it.  She can remember her name, and her daughters name but doesn't seem to remember much more.      Assessment/Plan: Cholecystitis/cholelithiasis/gallstone pancreatitis Severe dementia/she is in Memory care unit at [facility] Type II Hypertension Hx of Anxiety/Depression    Plan:  She need to to get over her pancreatitis before we can do her cholecystectomy.  With both the CT and the US showing thickening and edema I would start her on antibiotics for her gallbladder.  Her CBC is better, but on exam she is still having pain right side and mid abdomen.  I will discuss with Dr. Kieth Brightly, and we will follow with you.  Amy [name] 01/10/2016, 9:28 AM

## 2016-01-10 NOTE — ED Notes (Signed)
Patient shaking a small amount. Checked CBG.

## 2016-01-10 NOTE — Progress Notes (Signed)
Pt received at 1330 and report received at bedside. Pt is no apparent distress. Full assessment completed and charted. Family members at bedside. Pt stable. Will continue to monitor.

## 2016-01-10 NOTE — Progress Notes (Signed)
I've seen and examined Ms. Turek at bedside in the presence of family members including her daughter who is the power of attorney, and reviewed her chart. Appreciate general surgery. Patient has acute cholecystitis with pancreatitis. Unfortunately, she has advanced dementia therefore cannot give a meaningful history. She was admitted earlier today by Dr. Katrinka Blazing. Please refer to his comprehensive H&P for details. Patient will need cholecystectomy. Will follow general surgery recommendations.

## 2016-01-10 NOTE — H&P (Signed)
[facility] History and Physical  JACEE ENERSON WGN:[id number] DOB: 06-Apr-1941 DOA: 01/09/2016  Referring physician: ED PCP: Tommy Rainwater, MD   Chief Complaint: Abdominal pain  HPI:  Ms. Amy [name] is a 75 year old female with past medical history significant for Alzheimer's dementia, hypertension, hyperlipidemia, and diabetes mellitus type 2; who presents with complaints of abdominal pain. History is obtained from the patient's daughter is present at bedside as the patient has significant Alzheimer's dementia and is currently living at [facility] in the memory care unit. Orlene Erm was called regarding her mother complaining of abdominal pain mostly noted on the right upper quadrant for which they initially thought she was complaining of chest pain. There is no note of any nausea, vomiting, fever, or chills. Daughter also notes that her mother's medication regimen has recently changed and that Depakote is no longer on her medication list. She also notes that Lynden Ang is the patient's POA and her telephone number is [phone].  Upon arrival patient was evaluated with a CT of the abdomen and pelvis which revealed signs of gallbladder wall thickening suggestive of cholecystitis. Subsequently an ultrasound was also obtained which confirmed signs of cholecystitis with no significant bile duct dilatation. Initial lab work showed WBC of 12.2, lipase of > 3000, AST 113, ALT 49. Surgery was consulted for further evaluation of the patient   Review of Systems  Constitutional: Negative for fever and chills.  Gastrointestinal: Positive for abdominal pain.  A Complete review of systems cannot be obtained secondary to patient's dementia   Past Medical History  Diagnosis Date  . Type 2 diabetes mellitus (HCC)   . Hypercholesterolemia with hyperglyceridemia   . HTN (hypertension)   . Chest pain     adenosine myoview (10/10) showed EF 72% and ischemia in the apical anterior wall  and true apex. LHC (11/10): EF 60%, LVEDP 11, separate ostia for LAD and CFX, mild luminal irregularities in the LAD, no obstructive disease  . Anxiety   . Depression   . Dementia of the Alzheimer's type   . Alzheimer's disease 02/22/2015     Past Surgical History  Procedure Laterality Date  . Colonoscopy      07/02/2010  . Cardiac catheterization  2010    negative  . Tubal ligation    . Cataract extraction Bilateral       Social History:  reports that she quit smoking about 28 years ago. She has never used smokeless tobacco. She reports that she does not drink alcohol or use illicit drugs.   Allergies  Allergen Reactions  . Latex Other (See Comments)    MAR  . Lipitor [Atorvastatin] Other (See Comments)    MAR  . Shellfish-Derived Products Other (See Comments)    MAR    Family History  Problem Relation Age of Onset  . Coronary artery disease      No premature  . Cancer Mother 59    breast  . Hypertension Father   . Stroke Father   . Hypertension Brother   . Heart disease Brother   . Cancer Sister     breast  . Dementia Paternal Aunt   . Dementia Paternal Aunt   . Dementia Paternal Aunt        Prior to Admission medications   Medication Sig Start Date End Date Taking? Authorizing Provider  acetaminophen (TYLENOL) 325 MG tablet Take 650 mg by mouth every 4 (four) hours as needed for moderate pain.   Yes Historical Provider, MD  ALPRAZolam Prudy Feeler)  0.25 MG tablet Take 0.25 mg by mouth every 4 (four) hours as needed for anxiety.   Yes Historical Provider, MD  aspirin 81 MG tablet Take 81 mg by mouth daily.     Yes Historical Provider, MD  busPIRone (BUSPAR) 10 MG tablet Take 10 mg by mouth 3 (three) times daily.   Yes Historical Provider, MD  cholecalciferol (VITAMIN D) 1000 UNITS tablet Take 1,000 Units by mouth daily.   Yes Historical Provider, MD  enalapril (VASOTEC) 20 MG tablet TAKE 1 TABLET BY MOUTH EVERY MORNING 09/07/14  Yes Lucky Cowboy, MD   levofloxacin (LEVAQUIN) 500 MG tablet Take 500 mg by mouth daily. 7 Day Course. ABT Start Date 01/05/16 & End Date 01/11/16.   Yes Historical Provider, MD  Melatonin 5 MG TABS Take 10 mg by mouth at bedtime.   Yes Historical Provider, MD  Memantine HCl ER (NAMENDA XR) 21 MG CP24 Take 1 tablet by mouth daily.   Yes Historical Provider, MD  metFORMIN (GLUCOPHAGE) 500 MG tablet Take 500 mg by mouth daily.    Yes Historical Provider, MD  mirtazapine (REMERON) 30 MG tablet Take 30 mg by mouth at bedtime.   Yes Historical Provider, MD  QUEtiapine (SEROQUEL) 50 MG tablet Take 50 mg by mouth at bedtime.   Yes Historical Provider, MD  rivastigmine (EXELON) 4.6 mg/24hr Place 4.6 mg onto the skin daily.   Yes Historical Provider, MD  saccharomyces boulardii (FLORASTOR) 250 MG capsule Take 250 mg by mouth daily. 14 Day Course.   Yes Historical Provider, MD  sertraline (ZOLOFT) 25 MG tablet Take 25 mg by mouth daily.   Yes Historical Provider, MD  BAYER CONTOUR TEST test strip USE TO CHECK GLUCOSE EVERY DAY 08/23/14   Lucky Cowboy, MD  cetirizine (ZYRTEC) 10 MG tablet Take 10 mg by mouth daily as needed for allergies.    Historical Provider, MD  fenofibrate micronized (LOFIBRA) 134 MG capsule TAKE ONE CAPSULE BY MOUTH DAILY Patient not taking: Reported on 01/09/2016 10/08/14   Lucky Cowboy, MD  Memantine HCl ER (NAMENDA XR) 28 MG CP24 Take 28 mg by mouth every evening. At 5pm Patient not taking: Reported on 11/23/2015 03/21/14   Lucky Cowboy, MD  metFORMIN (GLUCOPHAGE-XR) 500 MG 24 hr tablet Take 1 tablet (500 mg total) by mouth daily with breakfast. Patient not taking: Reported on 11/23/2015 09/19/14   Quentin Mulling, PA-C     Physical Exam: Filed Vitals:   01/09/16 1728 01/09/16 2035 01/09/16 2208 01/10/16 0123  BP: 137/74 147/79 135/72 141/82  Pulse: 99 74 73 70  Temp: 98.2 F (36.8 C)     TempSrc: Oral     Resp: SpO2: 97% 99% 98% 99%     Constitutional: Vital signs reviewed.  Patient is a well-developed and well-nourished in no acute distress and cooperative with exam. Alert and oriented x1.  Head: Normocephalic and atraumatic  Ear: TM normal bilaterally  Mouth: no erythema or exudates, MMM  Eyes: PERRL, EOMI, conjunctivae normal, No scleral icterus.  Neck: Supple, Trachea midline normal ROM, No JVD, mass, thyromegaly, or carotid bruit present.  Cardiovascular: RRR, S1 normal, S2 normal, no MRG, pulses symmetric and intact bilaterally  Pulmonary/Chest: CTAB, no wheezes, rales, or rhonchi  Abdominal: Soft. Tenderness to palpation of the right upper quadrant GU: no CVA tenderness Musculoskeletal: No joint deformities, erythema, or stiffness, ROM full and no nontender Ext: no edema and no cyanosis, pulses palpable bilaterally (DP and PT)  Hematology: no cervical,  inginal, or axillary adenopathy.  Neurological: A&Ox 1 Strenght is normal and symmetric bilaterally, cranial nerve II-XII are grossly intact, no focal motor deficit, sensory intact to light touch bilaterally.  Skin: Warm, dry and intact. No rash, cyanosis, or clubbing.  Psychiatric: Normal mood and affect. speech and behavior is normal. Judgment and thought content normal. Cognition and memory are normal.      Data Review   Micro Results No results found for this or any previous visit (from the past 240 hour(s)).  Radiology Reports Ct Abdomen Pelvis W Contrast  01/09/2016  CLINICAL DATA:  Abdominal pain EXAM: CT ABDOMEN AND PELVIS WITH CONTRAST TECHNIQUE: Multidetector CT imaging of the abdomen and pelvis was performed using the standard protocol following bolus administration of intravenous contrast. CONTRAST:  OMNIPAQUE IOHEXOL 300 MG/ML SOLN, 25mL OMNIPAQUE IOHEXOL 300 MG/ML SOLN COMPARISON:  None. FINDINGS: The gallbladder is distended. There is wall thickening and wall edema. No obvious gallstones are present. Liver, spleen, pancreas, adrenal glands are within normal limits. Kidneys are within  normal limits. Normal appendix. The uterus is markedly lobulated. A left pelvic pedunculated fibroid is suspected. Unremarkable bladder. Small amount of free fluid is nonspecific in the right hemipelvis and about the liver. Sigmoid diverticulosis. There is suspected to be a 2.0 cm lipoma in the distal descending colon. There is no disproportionate dilatation of proximal colon to suggest obstruction. Lower abdominal ventral hernia contains adipose tissue. Advanced degenerative disc disease at L5-S1. Less prominent degenerative disc disease in the remainder of the lumbar spine levels. Atherosclerotic calcifications of the aorta and iliac vasculature. IMPRESSION: Gallbladder is distended with wall edema. Acalculous cholecystitis is not excluded. Ultrasound may be helpful. Small amount of free fluid is nonspecific. Small lipoma in the descending colon without evidence of bowel obstruction. Ventral hernia containing adipose tissue. Degenerative disc disease in lumbar spine. Electronically Signed   By: Jolaine Click M.D.   On: 01/09/2016 20:26   US Abdomen Limited  01/09/2016  CLINICAL DATA:  75 year old female with right upper quadrant pain and distended gallbladder on CT scan. EXAM: US ABDOMEN LIMITED - RIGHT UPPER QUADRANT COMPARISON:  CT scan of the abdomen and pelvis obtained earlier today FINDINGS: Gallbladder: Small mobile echogenic foci consistent with cholelithiasis. The gallbladder wall is thickened measuring up to 5 mm. There is a small amount of pericholecystic fluid. Per the sonographer, the sonographic Eulah Pont sign is negative. Common bile duct: Diameter: Within normal limits at 6- 7 mm. Liver: No focal lesion identified. Within normal limits in parenchymal echogenicity. IMPRESSION: Cholelithiasis with gallbladder wall thickening and trace pericholecystic fluid. Sonographic findings can be consistent with acute cholecystitis in the appropriate clinical setting. Of note, the sonographic Eulah Pont sign was  reportedly negative. Electronically Signed   By: Malachy Moan M.D.   On: 01/09/2016 22:15     CBC  Recent Labs Lab 01/09/16 1840  WBC 12.2*  HGB 12.1  HCT 37.6  PLT 349  MCV 95.7  MCH 30.8  MCHC 32.2  RDW 13.4  LYMPHSABS 1.3  MONOABS 1.2*  EOSABS 0.1  BASOSABS 0.0    Chemistries   Recent Labs Lab 01/09/16 1840  NA 137  K 3.4*  CL 101  CO2 27  GLUCOSE 149*  BUN 17  CREATININE 0.71  CALCIUM 9.0  AST 113*  ALT 49  ALKPHOS 93  BILITOT 0.5   ------------------------------------------------------------------------------------------------------------------ CrCl cannot be calculated (Unknown ideal weight.). ------------------------------------------------------------------------------------------------------------------ No results for input(s): HGBA1C in the last 72 hours. ------------------------------------------------------------------------------------------------------------------ No results for input(s):  CHOL, HDL, LDLCALC, TRIG, CHOLHDL, LDLDIRECT in the last 72 hours. ------------------------------------------------------------------------------------------------------------------ No results for input(s): TSH, T4TOTAL, T3FREE, THYROIDAB in the last 72 hours.  Invalid input(s): FREET3 ------------------------------------------------------------------------------------------------------------------ No results for input(s): VITAMINB12, FOLATE, FERRITIN, TIBC, IRON, RETICCTPCT in the last 72 hours.  Coagulation profile No results for input(s): INR, PROTIME in the last 168 hours.  No results for input(s): DDIMER in the last 72 hours.  Cardiac Enzymes No results for input(s): CKMB, TROPONINI, MYOGLOBIN in the last 168 hours.  Invalid input(s): CK ------------------------------------------------------------------------------------------------------------------ Invalid input(s): POCBNP   CBG: No results for input(s): GLUCAP in the last 168  hours.     EKG: Independently reviewed. Normal sinus rhythm   Assessment/Plan   Acute Cholecystitis: Patient with acute abdominal pain complaints on admission. UA with a CT scan which is signs of cholecystitis subsequently confirmed on ultrasound.  - Admit to MedSurg bed - NPO for now - Follow-up surgery recommendations - IV Morphine prn pain   Acute pancreatitis: Found to have elevated lipase greater than 3000. Ultrasound showed no signs of acute bile duct dilatation. Question possibility of acute gallstone induced pancreatitis versus hypertriglyceridemia. - IV fluids normal saline at 100 mL per hour - Check lipid panel - Continuous pulse oximetry - Trend troponins as there was question of chest pain complaints  Acute abdominal pain: Most likely secondary to the above. - See above plan   Diabetes mellitus type 2 - Held metformin - CBGs every with meals utilizing assistive sliding scale of insulin   Leukocytosis: WBC elevated at 12.2 suspect reactive in nature -Continue to monitor   HTN (hypertension) - continue Enalapril  Diabetes mellitus type 2: Appears to be well controlled only on metformin at this time. Patient's last hemoglobin A1c was over one year ago and noted to be 7. - Held metformin - CBGs every before meals with sensitive sliding scale insulin   Alzheimer's disease: Stable - Fall precautions    Hyperlipidemia:  - Check lipid panel - Continue fenofibrate    Code Status:   full Family Communication: bedside Disposition Plan: admit   Total time spent 55 minutes.Greater than 50% of this time was spent in counseling, explanation of diagnosis, planning of further management, and coordination of care  Clydie Braun [facility] Pager [phone]  If 7PM-7AM, please contact night-coverage [email or web address] Password [email or web address] 01/10/2016, 1:25 AM

## 2016-01-10 NOTE — Progress Notes (Addendum)
8:15a- CSW attempted to speak with patient at bedside. No family was present during this encounter. Patient woke up as CSW walked in the room and went back to sleep. CSW unable to assess and obtain information at this time.   9:30a- CSW attempted to speak with patient at bedside. CSW unable to assess and obtain information from patient at this time.  Elenore Paddy [phone] ED CSW 01/10/2016 8:36 AM

## 2016-01-10 NOTE — ED Notes (Signed)
Waiting on pharmacy for medications

## 2016-01-11 LAB — COMPREHENSIVE METABOLIC PANEL
ALBUMIN: 3.6 g/dL (ref 3.5–5.0)
ALK PHOS: 95 U/L (ref 38–126)
ALT: 48 U/L (ref 14–54)
ALT: 43 U/L (ref 14–54)
ANION GAP: 14 (ref 5–15)
ANION GAP: 11 (ref 5–15)
AST: 44 U/L — ABNORMAL HIGH (ref 15–41)
AST: 40 U/L (ref 15–41)
Albumin: 3.5 g/dL (ref 3.5–5.0)
Alkaline Phosphatase: 107 U/L (ref 38–126)
BILIRUBIN TOTAL: 1.2 mg/dL (ref 0.3–1.2)
BUN: 9 mg/dL (ref 6–20)
BUN: 8 mg/dL (ref 6–20)
CALCIUM: 8.7 mg/dL — AB (ref 8.9–10.3)
CO2: 21 mmol/L — AB (ref 22–32)
CO2: 25 mmol/L (ref 22–32)
Calcium: 8.8 mg/dL — ABNORMAL LOW (ref 8.9–10.3)
Chloride: 103 mmol/L (ref 101–111)
Chloride: 99 mmol/L — ABNORMAL LOW (ref 101–111)
Creatinine, Ser: 0.64 mg/dL (ref 0.44–1.00)
Creatinine, Ser: 0.56 mg/dL (ref 0.44–1.00)
GFR calc Af Amer: >60 mL/min (ref >60)
GFR calc non Af Amer: >60 mL/min (ref >60)
GFR calc non Af Amer: >60 mL/min (ref >60)
GLUCOSE: 107 mg/dL — AB (ref 65–99)
Glucose, Bld: 143 mg/dL — ABNORMAL HIGH (ref 65–99)
POTASSIUM: 3.4 mmol/L — AB (ref 3.5–5.1)
Potassium: 3.3 mmol/L — ABNORMAL LOW (ref 3.5–5.1)
SODIUM: 138 mmol/L (ref 135–145)
SODIUM: 135 mmol/L (ref 135–145)
TOTAL PROTEIN: 6.3 g/dL — AB (ref 6.5–8.1)
Total Bilirubin: 1.1 mg/dL (ref 0.3–1.2)
Total Protein: 6.7 g/dL (ref 6.5–8.1)

## 2016-01-11 LAB — CBC
HCT: 38.9 % (ref 36.0–46.0)
HEMATOCRIT: 36.2 % (ref 36.0–46.0)
HEMOGLOBIN: 12.1 g/dL (ref 12.0–15.0)
Hemoglobin: 12.8 g/dL (ref 12.0–15.0)
MCH: 30.8 pg (ref 26.0–34.0)
MCH: 31.2 pg (ref 26.0–34.0)
MCHC: 32.9 g/dL (ref 30.0–36.0)
MCHC: 33.4 g/dL (ref 30.0–36.0)
MCV: 93.7 fL (ref 78.0–100.0)
MCV: 93.3 fL (ref 78.0–100.0)
PLATELETS: 322 10*3/uL (ref 150–400)
PLATELETS: 295 10*3/uL (ref 150–400)
RBC: 4.15 MIL/uL (ref 3.87–5.11)
RBC: 3.88 MIL/uL (ref 3.87–5.11)
RDW: 13.4 % (ref 11.5–15.5)
RDW: 13.1 % (ref 11.5–15.5)
WBC: 10.5 10*3/uL (ref 4.0–10.5)
WBC: 11.6 10*3/uL — ABNORMAL HIGH (ref 4.0–10.5)

## 2016-01-11 LAB — AMMONIA: Ammonia: 21 umol/L (ref 9–35)

## 2016-01-11 LAB — GLUCOSE, CAPILLARY
GLUCOSE-CAPILLARY: 97 mg/dL (ref 65–99)
GLUCOSE-CAPILLARY: 95 mg/dL (ref 65–99)
Glucose-Capillary: 101 mg/dL — ABNORMAL HIGH (ref 65–99)
Glucose-Capillary: 88 mg/dL (ref 65–99)

## 2016-01-11 LAB — URINE CULTURE

## 2016-01-11 LAB — LIPASE, BLOOD: Lipase: 44 U/L (ref 11–51)

## 2016-01-11 MED ORDER — AMOXICILLIN-POT CLAVULANATE 875-125 MG PO TABS
1.0000 | ORAL_TABLET | Freq: Two times a day (BID) | ORAL | Status: DC
  Administered 2016-01-11 – 2016-01-12 (×2): 1 via ORAL
  Filled 2016-01-11 (×3): qty 1

## 2016-01-11 MED ORDER — POTASSIUM CHLORIDE 10 MEQ/100ML IV SOLN
10.0000 meq | INTRAVENOUS | Status: AC
  Administered 2016-01-11 (×3): 10 meq via INTRAVENOUS
  Filled 2016-01-11 (×3): qty 100

## 2016-01-11 MED ORDER — MELATONIN 5 MG PO TABS: 10.0000 mg | ORAL_TABLET | Freq: Every day | ORAL | Status: DC

## 2016-01-11 MED ORDER — ALPRAZOLAM 0.25 MG PO TABS
0.2500 mg | ORAL_TABLET | Freq: Every day | ORAL | Status: DC
  Administered 2016-01-11 – 2016-01-16 (×6): 0.25 mg via ORAL
  Filled 2016-01-11 (×6): qty 1

## 2016-01-11 NOTE — Progress Notes (Signed)
[facility] PROGRESS NOTE  Amy [name] TDD:[id number] DOB: Apr 20, 1941 DOA: 01/09/2016 PCP: Tommy Rainwater, MD  Summary 01/10/16: Peggye Form seen and examined Amy [name] at bedside in the presence of family members including her daughter who is the power of attorney, and reviewed her chart. Appreciate general surgery. Patient has acute cholecystitis with pancreatitis. Unfortunately, she has advanced dementia therefore cannot give a meaningful history. She was admitted earlier today by Dr. Katrinka Blazing. Please refer to his comprehensive H&P for details. Patient will need cholecystectomy. Will follow general surgery recommendations. 01/11/16: Appreciate general surgery. Patient has gallstone pancreatitis with cholecystitis. Pancreatitis has resolved pretty quickly with lipase now in normal range from a level greater than 3000 at admission. Patient states she is hungry. Noted discussions regarding holding off on surgery. Family will continue to reevaluate, understanding that patient will have chronic infection if she does not undergo cholecystectomy. We'll therefore start clear liquid diet and transition her to oral antibiotics to see how she does. Plan Cholecystitis/Acute gallstone pancreatitis/Abdominal pain  Start clear liquids  DC Zosyn  Augmentin  Continue IV fluids  Follow liver enzymes/lipase level in a.m.  Follow general surgery recommendations  Continue to follow with family regarding long-term decision Hyperlipidemia/Diabetes mellitus type 2, controlled (HCC)/HTN (hypertension)/Alzheimer's disease  Plan to resume home medications when patient can eat consistently     Code Status: DNR/DNI Family Communication: Daughter at bedside, sister over the phone Disposition Plan: ?Back to SNF this weekend if no surgery   Consultants:  General surgery  Procedures:  None  Antibiotics:  Zosyn 01/10/2016>>  01/11/2016  (12/28/1615>>  HPI/Subjective: "Ok"  Objective: Filed Vitals:   01/11/16 1121 01/11/16 1405  BP: 152/80 144/69  Pulse:  87  Temp:  99.1 F (37.3 C)  Resp:  15    Intake/Output Summary (Last 24 hours) at 01/11/16 1531 Last data filed at 01/11/16 1000  Gross per 24 hour  Intake   1600 ml  Output    650 ml  Net    950 ml   Filed Weights   01/10/16 1700  Weight: 68.04 kg (150 lb)    Exam:   General:  Comfortable at rest.  Cardiovascular: S1-S2 normal. No murmurs. Pulse regular.  Respiratory: Good air entry bilaterally. No rhonchi or rales.  Abdomen: Soft and nontender. Normal bowel sounds. No organomegaly.  Musculoskeletal: No pedal edema   Neurological: Intact  Data Reviewed: Basic Metabolic Panel:  Recent Labs Lab 01/09/16 1840 01/10/16 0433 01/11/16 0100  NA 137 141 138  K 3.4* 3.7 3.3*  CL 101 106 103  CO2 27 25 21*  GLUCOSE 149* 122* 143*  BUN CREATININE 0.71 0.63 0.64  CALCIUM 9.0 8.8* 8.7*   Liver Function Tests:  Recent Labs Lab 01/09/16 1840 01/10/16 0433 01/11/16 0100  AST 113* 79* 44*  ALT 49 68* 48  ALKPHOS 93 95 95  BILITOT 0.5 0.7 1.2  PROT 6.7 6.0* 6.3*  ALBUMIN 3.9 3.7 3.5    Recent Labs Lab 01/09/16 1840 01/10/16 0433 01/11/16 0100  LIPASE >3000* 275* 44   No results for input(s): AMMONIA in the last 168 hours. CBC:  Recent Labs Lab 01/09/16 1840 01/10/16 0433 01/11/16 0100  WBC 12.2* 8.0 10.5  NEUTROABS 9.5*  --   --   HGB 12.1 12.3 12.8  HCT 37.6 37.6 38.9  MCV 95.7 94.9 93.7  PLT 349 319 322   Cardiac Enzymes: No results for input(s): CKTOTAL, CKMB, CKMBINDEX, TROPONINI in the last 168 hours.  BNP (last 3 results) No results for input(s): BNP in the last 8760 hours.  ProBNP (last 3 results) No results for input(s): PROBNP in the last 8760 hours.  CBG:  Recent Labs Lab 01/10/16 0708 01/10/16 1628 01/10/16 2251 01/11/16 0801 01/11/16 1209  GLUCAP 97 98 108* 97 101*     Recent Results (from the past 240 hour(s))  Urine culture     Status: None   Collection Time: 01/09/16  6:34 PM  Result Value Ref Range Status   Specimen Description URINE, CLEAN CATCH  Final   Special Requests NONE  Final   Culture   Final    1,000 COLONIES/mL INSIGNIFICANT GROWTH Performed at [facility]    Report Status 01/11/2016 FINAL  Final     Studies: Ct Abdomen Pelvis W Contrast  01/09/2016  CLINICAL DATA:  Abdominal pain EXAM: CT ABDOMEN AND PELVIS WITH CONTRAST TECHNIQUE: Multidetector CT imaging of the abdomen and pelvis was performed using the standard protocol following bolus administration of intravenous contrast. CONTRAST:  OMNIPAQUE IOHEXOL 300 MG/ML SOLN, 25mL OMNIPAQUE IOHEXOL 300 MG/ML SOLN COMPARISON:  None. FINDINGS: The gallbladder is distended. There is wall thickening and wall edema. No obvious gallstones are present. Liver, spleen, pancreas, adrenal glands are within normal limits. Kidneys are within normal limits. Normal appendix. The uterus is markedly lobulated. A left pelvic pedunculated fibroid is suspected. Unremarkable bladder. Small amount of free fluid is nonspecific in the right hemipelvis and about the liver. Sigmoid diverticulosis. There is suspected to be a 2.0 cm lipoma in the distal descending colon. There is no disproportionate dilatation of proximal colon to suggest obstruction. Lower abdominal ventral hernia contains adipose tissue. Advanced degenerative disc disease at L5-S1. Less prominent degenerative disc disease in the remainder of the lumbar spine levels. Atherosclerotic calcifications of the aorta and iliac vasculature. IMPRESSION: Gallbladder is distended with wall edema. Acalculous cholecystitis is not excluded. Ultrasound may be helpful. Small amount of free fluid is nonspecific. Small lipoma in the descending colon without evidence of bowel obstruction. Ventral hernia containing adipose tissue. Degenerative disc disease in  lumbar spine. Electronically Signed   By: Jolaine Click M.D.   On: 01/09/2016 20:26   US Abdomen Limited  01/09/2016  CLINICAL DATA:  75 year old female with right upper quadrant pain and distended gallbladder on CT scan. EXAM: US ABDOMEN LIMITED - RIGHT UPPER QUADRANT COMPARISON:  CT scan of the abdomen and pelvis obtained earlier today FINDINGS: Gallbladder: Small mobile echogenic foci consistent with cholelithiasis. The gallbladder wall is thickened measuring up to 5 mm. There is a small amount of pericholecystic fluid. Per the sonographer, the sonographic Eulah Pont sign is negative. Common bile duct: Diameter: Within normal limits at 6- 7 mm. Liver: No focal lesion identified. Within normal limits in parenchymal echogenicity. IMPRESSION: Cholelithiasis with gallbladder wall thickening and trace pericholecystic fluid. Sonographic findings can be consistent with acute cholecystitis in the appropriate clinical setting. Of note, the sonographic Eulah Pont sign was reportedly negative. Electronically Signed   By: Malachy Moan M.D.   On: 01/09/2016 22:15    Scheduled Meds: . amoxicillin-clavulanate  1 tablet Oral Q12H  . aspirin  81 mg Oral Daily  . cholecalciferol  1,000 Units Oral Daily  . enalapril  20 mg Oral q morning - 10a  . enoxaparin (LOVENOX) injection  40 mg Subcutaneous QHS  . insulin aspart  0-9 Units Subcutaneous TID WC  . loratadine  10 mg Oral Daily  . memantine  21 mg Oral Daily  .  mirtazapine  22.5 mg Oral QHS  . pantoprazole (PROTONIX) IV  40 mg Intravenous Q24H  . potassium chloride  10 mEq Intravenous Q1 Hr x 3  . QUEtiapine  50 mg Oral QHS  . rivastigmine  4.6 mg Transdermal Daily   Continuous Infusions: . sodium chloride 100 mL/hr at 01/10/16 2036     Time spent: 25 minutes    Derenda Giddings  [facility] Pager [phone]. If 7PM-7AM, please contact night-coverage at [email or web address], password [email or web address] 01/11/2016, 3:31 PM  LOS: 2 days

## 2016-01-11 NOTE — Progress Notes (Signed)
Pt resting quietly with no agitation noted. Confusion noted yet pt's baseline.

## 2016-01-11 NOTE — Progress Notes (Signed)
PHARMACIST - PHYSICIAN ORDER COMMUNICATION  CONCERNING: P&T Medication Policy on Herbal Medications  DESCRIPTION:  This patient's order for:  melatonin  has been noted.  This product(s) is classified as an "herbal" or natural product. Due to a lack of definitive safety studies or FDA approval, nonstandard manufacturing practices, plus the potential risk of unknown drug-drug interactions while on inpatient medications, the Pharmacy and Therapeutics Committee does not permit the use of "herbal" or natural products of this type within [facility].   ACTION TAKEN: The pharmacy department is unable to verify this order at this time and your patient has been informed of this safety policy. Please reevaluate patient's clinical condition at discharge and address if the herbal or natural product(s) should be resumed at that time.  Thanks, Dorethea Clan, PharmD 01/11/2016

## 2016-01-11 NOTE — Progress Notes (Signed)
Pt up to [facility] with assist of NT and safety sitter. Agitated and combative. Nurse in to assist. Able to verbally calm pt enough to potty and safely make it back to bed. Positioned for comfort and reassurance given. Daughter notified.

## 2016-01-12 ENCOUNTER — Inpatient Hospital Stay (HOSPITAL_COMMUNITY): Payer: Medicare Other | Admitting: Anesthesiology

## 2016-01-12 ENCOUNTER — Inpatient Hospital Stay (HOSPITAL_COMMUNITY): Payer: Medicare Other

## 2016-01-12 ENCOUNTER — Encounter (HOSPITAL_COMMUNITY): Payer: Self-pay

## 2016-01-12 ENCOUNTER — Encounter (HOSPITAL_COMMUNITY)
Admission: EM | Disposition: A | Discharge status: Skilled Nursing Facility | Payer: Self-pay | Source: Home / Self Care | Attending: Internal Medicine

## 2016-01-12 HISTORY — PX: CHOLECYSTECTOMY: SHX55

## 2016-01-12 LAB — COMPREHENSIVE METABOLIC PANEL WITH GFR
ALT: 37 U/L (ref 14–54)
AST: 32 U/L (ref 15–41)
Albumin: 3.1 g/dL — ABNORMAL LOW (ref 3.5–5.0)
Alkaline Phosphatase: 95 U/L (ref 38–126)
Anion gap: 11 (ref 5–15)
BUN: 8 mg/dL (ref 6–20)
CO2: 25 mmol/L (ref 22–32)
Calcium: 8.5 mg/dL — ABNORMAL LOW (ref 8.9–10.3)
Chloride: 100 mmol/L — ABNORMAL LOW (ref 101–111)
Creatinine, Ser: 0.68 mg/dL (ref 0.44–1.00)
GFR calc Af Amer: >60 mL/min
GFR calc non Af Amer: >60 mL/min
Glucose, Bld: 95 mg/dL (ref 65–99)
Potassium: 3.1 mmol/L — ABNORMAL LOW (ref 3.5–5.1)
Sodium: 136 mmol/L (ref 135–145)
Total Bilirubin: 1.1 mg/dL (ref 0.3–1.2)
Total Protein: 5.9 g/dL — ABNORMAL LOW (ref 6.5–8.1)

## 2016-01-12 LAB — CBC
HCT: 34.6 % — ABNORMAL LOW (ref 36.0–46.0)
HEMOGLOBIN: 11.7 g/dL — AB (ref 12.0–15.0)
MCH: 31.8 pg (ref 26.0–34.0)
MCHC: 33.8 g/dL (ref 30.0–36.0)
MCV: 94 fL (ref 78.0–100.0)
Platelets: 257 10*3/uL (ref 150–400)
RBC: 3.68 MIL/uL — ABNORMAL LOW (ref 3.87–5.11)
RDW: 13.3 % (ref 11.5–15.5)
WBC: 8.8 10*3/uL (ref 4.0–10.5)

## 2016-01-12 LAB — GLUCOSE, CAPILLARY
GLUCOSE-CAPILLARY: 92 mg/dL (ref 65–99)
GLUCOSE-CAPILLARY: 96 mg/dL (ref 65–99)
GLUCOSE-CAPILLARY: 265 mg/dL — AB (ref 65–99)
Glucose-Capillary: 66 mg/dL (ref 65–99)
Glucose-Capillary: 110 mg/dL — ABNORMAL HIGH (ref 65–99)

## 2016-01-12 LAB — SURGICAL PCR SCREEN
MRSA, PCR: NEGATIVE
Staphylococcus aureus: NEGATIVE

## 2016-01-12 LAB — LIPASE, BLOOD: Lipase: 19 U/L (ref 11–51)

## 2016-01-12 SURGERY — LAPAROSCOPIC CHOLECYSTECTOMY WITH INTRAOPERATIVE CHOLANGIOGRAM
Anesthesia: General | Site: Abdomen

## 2016-01-12 MED ORDER — BUPIVACAINE-EPINEPHRINE (PF) 0.25% -1:200000 IJ SOLN
INTRAMUSCULAR | Status: AC
  Filled 2016-01-12: qty 30

## 2016-01-12 MED ORDER — IOHEXOL 300 MG/ML  SOLN
INTRAMUSCULAR | Status: DC | PRN
  Administered 2016-01-12: 5 mL

## 2016-01-12 MED ORDER — GLYCOPYRROLATE 0.2 MG/ML IJ SOLN
INTRAMUSCULAR | Status: AC
  Filled 2016-01-12: qty 3

## 2016-01-12 MED ORDER — LACTATED RINGERS IR SOLN
Status: DC | PRN
  Administered 2016-01-12: 1000 mL

## 2016-01-12 MED ORDER — 0.9 % SODIUM CHLORIDE (POUR BTL) OPTIME
TOPICAL | Status: DC | PRN
  Administered 2016-01-12: 1000 mL

## 2016-01-12 MED ORDER — LACTATED RINGERS IV SOLN: INTRAVENOUS | Status: AC

## 2016-01-12 MED ORDER — FENTANYL CITRATE (PF) 250 MCG/5ML IJ SOLN
INTRAMUSCULAR | Status: AC
  Filled 2016-01-12: qty 5

## 2016-01-12 MED ORDER — PHENYLEPHRINE HCL 10 MG/ML IJ SOLN
INTRAMUSCULAR | Status: DC | PRN
  Administered 2016-01-12 (×3): 80 ug via INTRAVENOUS

## 2016-01-12 MED ORDER — POTASSIUM CHLORIDE CRYS ER 20 MEQ PO TBCR: 40.0000 meq | EXTENDED_RELEASE_TABLET | Freq: Once | ORAL | Status: DC

## 2016-01-12 MED ORDER — SUGAMMADEX SODIUM 200 MG/2ML IV SOLN
INTRAVENOUS | Status: AC
  Filled 2016-01-12: qty 2

## 2016-01-12 MED ORDER — SUCCINYLCHOLINE CHLORIDE 20 MG/ML IJ SOLN
INTRAMUSCULAR | Status: DC | PRN
  Administered 2016-01-12: 100 mg via INTRAVENOUS

## 2016-01-12 MED ORDER — LACTATED RINGERS IV SOLN: INTRAVENOUS | Status: DC

## 2016-01-12 MED ORDER — DEXTROSE 50 % IV SOLN
INTRAVENOUS | Status: AC
  Filled 2016-01-12: qty 50

## 2016-01-12 MED ORDER — LIDOCAINE HCL (CARDIAC) 20 MG/ML IV SOLN
INTRAVENOUS | Status: AC
  Filled 2016-01-12: qty 5

## 2016-01-12 MED ORDER — FENTANYL CITRATE (PF) 100 MCG/2ML IJ SOLN: 25.0000 ug | INTRAMUSCULAR | Status: DC | PRN

## 2016-01-12 MED ORDER — POTASSIUM CHLORIDE 10 MEQ/100ML IV SOLN
10.0000 meq | INTRAVENOUS | Status: AC
  Administered 2016-01-12 (×3): 10 meq via INTRAVENOUS
  Filled 2016-01-12 (×4): qty 100

## 2016-01-12 MED ORDER — LACTATED RINGERS IV SOLN
INTRAVENOUS | Status: DC | PRN
  Administered 2016-01-12: 16:00:00 via INTRAVENOUS

## 2016-01-12 MED ORDER — PIPERACILLIN-TAZOBACTAM 3.375 G IVPB
INTRAVENOUS | Status: AC
  Filled 2016-01-12: qty 50

## 2016-01-12 MED ORDER — DEXAMETHASONE SODIUM PHOSPHATE 10 MG/ML IJ SOLN
INTRAMUSCULAR | Status: DC | PRN
  Administered 2016-01-12: 10 mg via INTRAVENOUS

## 2016-01-12 MED ORDER — PROPOFOL 10 MG/ML IV BOLUS
INTRAVENOUS | Status: AC
  Filled 2016-01-12: qty 20

## 2016-01-12 MED ORDER — ONDANSETRON HCL 4 MG/2ML IJ SOLN
INTRAMUSCULAR | Status: DC | PRN
  Administered 2016-01-12: 4 mg via INTRAVENOUS

## 2016-01-12 MED ORDER — NEOSTIGMINE METHYLSULFATE 10 MG/10ML IV SOLN
INTRAVENOUS | Status: AC
  Filled 2016-01-12: qty 1

## 2016-01-12 MED ORDER — FENTANYL CITRATE (PF) 100 MCG/2ML IJ SOLN
INTRAMUSCULAR | Status: DC | PRN
  Administered 2016-01-12 (×2): 50 ug via INTRAVENOUS

## 2016-01-12 MED ORDER — ROCURONIUM BROMIDE 100 MG/10ML IV SOLN
INTRAVENOUS | Status: AC
  Filled 2016-01-12: qty 1

## 2016-01-12 MED ORDER — ONDANSETRON HCL 4 MG/2ML IJ SOLN
INTRAMUSCULAR | Status: AC
  Filled 2016-01-12: qty 2

## 2016-01-12 MED ORDER — PROPOFOL 10 MG/ML IV BOLUS
INTRAVENOUS | Status: DC | PRN
Start: 2016-01-12 — End: 2016-01-12
  Administered 2016-01-12: 100 mg via INTRAVENOUS

## 2016-01-12 MED ORDER — ROCURONIUM BROMIDE 100 MG/10ML IV SOLN
INTRAVENOUS | Status: DC | PRN
  Administered 2016-01-12: 30 mg via INTRAVENOUS
  Administered 2016-01-12: 10 mg via INTRAVENOUS

## 2016-01-12 MED ORDER — LIDOCAINE HCL (CARDIAC) 20 MG/ML IV SOLN
INTRAVENOUS | Status: DC | PRN
  Administered 2016-01-12: 70 mg via INTRAVENOUS

## 2016-01-12 MED ORDER — OXYCODONE HCL 5 MG PO TABS
10.0000 mg | ORAL_TABLET | ORAL | Status: DC | PRN
  Administered 2016-01-12 – 2016-01-13 (×2): 10 mg via ORAL
  Filled 2016-01-12 (×2): qty 2

## 2016-01-12 MED ORDER — FENTANYL CITRATE (PF) 100 MCG/2ML IJ SOLN
INTRAMUSCULAR | Status: AC
  Filled 2016-01-12: qty 2

## 2016-01-12 MED ORDER — BUPIVACAINE-EPINEPHRINE 0.25% -1:200000 IJ SOLN
INTRAMUSCULAR | Status: DC | PRN
  Administered 2016-01-12: 20 mL

## 2016-01-12 MED ORDER — PIPERACILLIN-TAZOBACTAM 3.375 G IVPB
3.3750 g | Freq: Three times a day (TID) | INTRAVENOUS | Status: AC
  Administered 2016-01-13 (×2): 3.375 g via INTRAVENOUS
  Filled 2016-01-12 (×2): qty 50

## 2016-01-12 MED ORDER — PIPERACILLIN-TAZOBACTAM 3.375 G IVPB 30 MIN: 3.3750 g | Freq: Three times a day (TID) | INTRAVENOUS | Status: DC

## 2016-01-12 MED ORDER — SUGAMMADEX SODIUM 200 MG/2ML IV SOLN
INTRAVENOUS | Status: DC | PRN
  Administered 2016-01-12: 200 mg via INTRAVENOUS

## 2016-01-12 MED ORDER — PIPERACILLIN-TAZOBACTAM 3.375 G IVPB
3.3750 g | Freq: Three times a day (TID) | INTRAVENOUS | Status: DC
  Administered 2016-01-12: 3.375 g via INTRAVENOUS
  Filled 2016-01-12 (×2): qty 50

## 2016-01-12 SURGICAL SUPPLY — 42 items
APPLIER CLIP ROT 10 11.4 M/L (STAPLE)
CABLE HIGH FREQUENCY MONO STRZ (ELECTRODE) ×3 IMPLANT
CATH CHOLANG 76X19 KUMAR (CATHETERS) ×3 IMPLANT
CHLORAPREP W/TINT 26ML (MISCELLANEOUS) ×3 IMPLANT
CLIP APPLIE ROT 10 11.4 M/L (STAPLE) IMPLANT
CLIP LIGATING HEM O LOK PURPLE (MISCELLANEOUS) IMPLANT
CLIP LIGATING HEMO LOK XL GOLD (MISCELLANEOUS) IMPLANT
COVER MAYO STAND STRL (DRAPES) ×3 IMPLANT
COVER SURGICAL LIGHT HANDLE (MISCELLANEOUS) ×3 IMPLANT
CUTTER FLEX LINEAR 45M (STAPLE) IMPLANT
DECANTER SPIKE VIAL GLASS SM (MISCELLANEOUS) ×3 IMPLANT
DRAIN CHANNEL 19F RND (DRAIN) IMPLANT
DRAPE C-ARM 42X120 X-RAY (DRAPES) ×3 IMPLANT
DRAPE LAPAROSCOPIC ABDOMINAL (DRAPES) ×3 IMPLANT
ELECT REM PT RETURN 9FT ADLT (ELECTROSURGICAL) ×3
ELECTRODE REM PT RTRN 9FT ADLT (ELECTROSURGICAL) ×1 IMPLANT
EVACUATOR SILICONE 100CC (DRAIN) IMPLANT
GLOVE BIOGEL PI IND STRL 7.0 (GLOVE) ×1 IMPLANT
GLOVE BIOGEL PI INDICATOR 7.0 (GLOVE) ×2
GLOVE SURG SS PI 7.0 STRL IVOR (GLOVE) ×3 IMPLANT
GOWN STRL REUS W/TWL LRG LVL3 (GOWN DISPOSABLE) ×3 IMPLANT
GOWN STRL REUS W/TWL XL LVL3 (GOWN DISPOSABLE) ×6 IMPLANT
KIT BASIN OR (CUSTOM PROCEDURE TRAY) ×3 IMPLANT
LIQUID BAND (GAUZE/BANDAGES/DRESSINGS) ×3 IMPLANT
POUCH RETRIEVAL ECOSAC 10 (ENDOMECHANICALS) ×1 IMPLANT
POUCH RETRIEVAL ECOSAC 10MM (ENDOMECHANICALS) ×2
RELOAD 45 VASCULAR/THIN (ENDOMECHANICALS) IMPLANT
RELOAD STAPLE TA45 3.5 REG BLU (ENDOMECHANICALS) IMPLANT
SCISSORS LAP 5X35 DISP (ENDOMECHANICALS) ×3 IMPLANT
SET IRRIG TUBING LAPAROSCOPIC (IRRIGATION / IRRIGATOR) IMPLANT
SHEARS HARMONIC ACE PLUS 36CM (ENDOMECHANICALS) IMPLANT
SLEEVE SURGEON STRL (DRAPES) ×3 IMPLANT
SLEEVE XCEL OPT CAN 5 100 (ENDOMECHANICALS) ×6 IMPLANT
STOPCOCK 4 WAY LG BORE MALE ST (IV SETS) ×3 IMPLANT
SUT ETHILON 2 0 PS N (SUTURE) IMPLANT
SUT MNCRL AB 4-0 PS2 18 (SUTURE) ×3 IMPLANT
SUT VICRYL 0 ENDOLOOP (SUTURE) IMPLANT
TOWEL OR 17X26 10 PK STRL BLUE (TOWEL DISPOSABLE) ×3 IMPLANT
TOWEL OR NON WOVEN STRL DISP B (DISPOSABLE) IMPLANT
TRAY LAPAROSCOPIC (CUSTOM PROCEDURE TRAY) ×3 IMPLANT
TROCAR BLADELESS OPT 5 100 (ENDOMECHANICALS) ×3 IMPLANT
TROCAR XCEL 12X100 BLDLESS (ENDOMECHANICALS) ×3 IMPLANT

## 2016-01-12 NOTE — Progress Notes (Signed)
[facility] PROGRESS NOTE  Amy [name]:[id number] DOB: [birth year] DOA: 01/09/2016 PCP: Tommy Rainwater, MD  Summary 01/10/16: Peggye Form seen and examined Amy [name] at bedside in the presence of family members including her daughter who is the power of attorney, and reviewed her chart. Appreciate general surgery. Patient has acute cholecystitis with pancreatitis. Unfortunately, she has advanced dementia therefore cannot give a meaningful history. She was admitted earlier today by Dr. Katrinka Blazing. Please refer to his comprehensive H&P for details. Patient will need cholecystectomy. Will follow general surgery recommendations. 01/11/16: Appreciate general surgery. Patient has gallstone pancreatitis with cholecystitis. Pancreatitis has resolved pretty quickly with lipase now in normal range from a level greater than 3000 at admission. Patient states she is hungry. Noted discussions regarding holding off on surgery. Family will continue to reevaluate, understanding that patient will have chronic infection if she does not undergo cholecystectomy. We'll therefore start clear liquid diet and transition her to oral antibiotics to see how she does. 01/12/16: Had fever last night after she was switched to Augmentin. This is obviously concerning. Family has decided to proceed with cholecystectomy after consulting with patient's primary care provider. Will inform general surgery-spoke with Dr. Sheliah Hatch. Appreciate help. Meanwhile, will change antibiotics back to Zosyn. Fortunately, lipase remains normal. Patient otherwise lethargic. Discussed with her daughter Amy [name] and sister Amy [name]. Plan Cholecystitis/Acute gallstone pancreatitis/Abdominal pain  Resume Zosyn  D/c Augmentin  Continue IV fluids  Follow up with general surgery  Continue to follow with family regarding long-term decision Hypokalemia  Likely related to poor intake  Replenish electrolytes as necessary Hyperlipidemia/Diabetes  mellitus type 2, controlled (HCC)/HTN (hypertension)/Alzheimer's disease  Plan to resume home medications when patient can eat consistently   Code Status: DNR/DNI Family Communication: Daughter at bedside, sister over the phone Disposition Plan: ?Back to SNF post op next week   Consultants:  General surgery  Procedures:  None  Antibiotics:  Zosyn 01/10/2016>> 01/11/2016, 01/12/16>>  Augmentin 01/11/1616>>01/12/16  HPI/Subjective: Patient not saying much today  Objective: Filed Vitals:   01/11/16 2056 01/12/16 0556  BP: 125/94 128/49  Pulse: 91 57  Temp: 101.4 F (38.6 C) 98.9 F (37.2 C)  Resp: 16 15    Intake/Output Summary (Last 24 hours) at 01/12/16 1203 Last data filed at 01/12/16 0600  Gross per 24 hour  Intake 1294.16 ml  Output      0 ml  Net 1294.16 ml   Filed Weights   01/10/16 1700  Weight: 68.04 kg (150 lb)    Exam:   General:  Comfortable at rest.  Cardiovascular: S1-S2 normal. No murmurs. Pulse regular.  Respiratory: Good air entry bilaterally. No rhonchi or rales.  Abdomen: Soft and nontender. Normal bowel sounds. No organomegaly.  Musculoskeletal: No pedal edema   Neurological: Lethargic  Data Reviewed: Basic Metabolic Panel:  Recent Labs Lab 01/09/16 1840 01/10/16 0433 01/11/16 0100 01/11/16 2130 01/12/16 0429  NA 137 141 138 135 136  K 3.4* 3.7 3.3* 3.4* 3.1*  CL 101 106 103 99* 100*  CO2 27 25 21* 25 25  GLUCOSE 149* 122* 143* 107* 95  BUN CREATININE 0.71 0.63 0.64 0.56 0.68  CALCIUM 9.0 8.8* 8.7* 8.8* 8.5*   Liver Function Tests:  Recent Labs Lab 01/09/16 1840 01/10/16 0433 01/11/16 0100 01/11/16 2130 01/12/16 0429  AST 113* 79* 44* 40 32  ALT 49 68* 48 43 37  ALKPHOS 93 95 95 107 95  BILITOT 0.5 0.7 1.2 1.1 1.1  PROT 6.7  6.0* 6.3* 6.7 5.9*  ALBUMIN 3.9 3.7 3.5 3.6 3.1*    Recent Labs Lab 01/09/16 1840 01/10/16 0433 01/11/16 0100 01/12/16 0429  LIPASE >3000* 275* 44 19     Recent Labs Lab 01/11/16 2130  AMMONIA 21   CBC:  Recent Labs Lab 01/09/16 1840 01/10/16 0433 01/11/16 0100 01/11/16 2130 01/12/16 0429  WBC 12.2* 8.0 10.5 11.6* 8.8  NEUTROABS 9.5*  --   --   --   --   HGB 12.1 12.3 12.8 12.1 11.7*  HCT 37.6 37.6 38.9 36.2 34.6*  MCV 95.7 94.9 93.7 93.3 94.0  PLT 349 319 322 295 257   Cardiac Enzymes: No results for input(s): CKTOTAL, CKMB, CKMBINDEX, TROPONINI in the last 168 hours. BNP (last 3 results) No results for input(s): BNP in the last 8760 hours.  ProBNP (last 3 results) No results for input(s): PROBNP in the last 8760 hours.  CBG:  Recent Labs Lab 01/10/16 2251 01/11/16 0801 01/11/16 1209 01/11/16 1619 01/11/16 2053  GLUCAP 108* 97 101* 95 88    Recent Results (from the past 240 hour(s))  Urine culture     Status: None   Collection Time: 01/09/16  6:34 PM  Result Value Ref Range Status   Specimen Description URINE, CLEAN CATCH  Final   Special Requests NONE  Final   Culture   Final    1,000 COLONIES/mL INSIGNIFICANT GROWTH Performed at [facility]    Report Status 01/11/2016 FINAL  Final     Studies: No results found.  Scheduled Meds: . ALPRAZolam  0.25 mg Oral QHS  . amoxicillin-clavulanate  1 tablet Oral Q12H  . aspirin  81 mg Oral Daily  . cholecalciferol  1,000 Units Oral Daily  . enalapril  20 mg Oral q morning - 10a  . enoxaparin (LOVENOX) injection  40 mg Subcutaneous QHS  . insulin aspart  0-9 Units Subcutaneous TID WC  . loratadine  10 mg Oral Daily  . memantine  21 mg Oral Daily  . mirtazapine  22.5 mg Oral QHS  . pantoprazole (PROTONIX) IV  40 mg Intravenous Q24H  . potassium chloride  40 mEq Oral Once  . QUEtiapine  50 mg Oral QHS  . rivastigmine  4.6 mg Transdermal Daily   Continuous Infusions: . sodium chloride 50 mL/hr at 01/12/16 0700     Time spent: 25 minutes    Chelesa Weingartner  [facility] Pager [phone]. If 7PM-7AM, please contact night-coverage  at [email or web address], password [facility] 01/12/2016, 12:03 PM  LOS: 3 days

## 2016-01-12 NOTE — Op Note (Signed)
Preoperative diagnosis: gallstone pancreatitis  Postoperative diagnosis: Same   Procedure: laparoscopic cholecystectomy with intraoperative cholangiogram  Surgeon: Feliciana Rossetti, M.D.  Asst: none  Anesthesia: Gen.   Indications for procedure: Amy [name] is a 75 y.o. female with symptoms of Abdominal pain consistent with gallbladder disease, Confirmed by Ultrasound and CT.  Description of procedure: The patient was brought into the operative suite, placed supine. Anesthesia was administered with endotracheal tube. Patient was strapped in place and foot board was secured. All pressure points were offloaded by foam padding. The patient was prepped and draped in the usual sterile fashion.  A small incision was made to the right of the umbilicus. A 5mm trocar was inserted into the peritoneal cavity with optical entry. Pneumoperitoneum was applied with high flow low pressure. 2 5mm trocars were placed in the RUQ. A 12mm trocar was placed in the subxiphoid space. All trocars sites were first anesthesized with 0.25% marcaine with epinephrine in the subcutaneous and preperitoneal layers. Next the patient was placed in reverse trendelenberg. The gallbladder was retracted cephalad and lateral. The peritoneum was reflected off the infundibulum working lateral to medial.   The cystic duct and cystic artery were identified and further dissection revealed a critical view, due to concern for choledocholithiasis a cholangiogram was performed with [name]. Normal ductal anatomy without filling defects was seen. The cystic duct and cystic artery were doubly clipped and ligated.   The gallbladder was removed off the liver bed with cautery. The Gallbladder was placed in a specimen bag. The gallbladder fossa was irrigated and hemostasis was applied with cautery. The gallbladder was removed via the 12mm trocar. No dilation of the fascia occurred. Pneumoperitoneum was removed, all trocar were removed. All  incisions were closed with 4-0 monocryl subcuticular stitch. The patient woke from anesthesia and was brought to PACU in stable condition.  Findings: inflamed gallbladder, normal ductal anatomy  Specimen: gallbldadder  Blood loss: 50cc  Local anesthesia: 20cc 0.25% marcaine  Complications: none  Feliciana Rossetti, M.D. General, Bariatric, & Minimally Invasive Surgery [facility] [place]

## 2016-01-12 NOTE — Progress Notes (Signed)
Patient ID: Amy [name], female   DOB: [birth year], 75 y.o.   MRN: [id number]     [facility]      [address] [place], [place] [address]    Phone: [phone] FAX: [phone]     Subjective: Temp 101.4 last night.  WBC normal.  LFTs normal.  Pt complains of abd pain.   Objective:  Vital signs:  Filed Vitals:   01/11/16 1121 01/11/16 1405 01/11/16 2056 01/12/16 0556  BP: 152/80 144/69 125/94 128/49  Pulse:  87 91 57  Temp:  99.1 F (37.3 C) 101.4 F (38.6 C) 98.9 F (37.2 C)  TempSrc:  Oral Axillary Axillary  Resp:  15 16 15   Height:      Weight:      SpO2:  97% 98% 94%    Last BM Date: 01/10/16  Intake/Output   Yesterday:  02/16 0701 - 02/17 0700 In: 1694.2 [I.V.:1694.2] Out: 0  This shift:     Physical Exam: General: Pt awake and pleasant.  Abdomen: Soft.  Nondistended.  ttp llq.  No evidence of peritonitis.  No incarcerated hernias.    Problem List:   Principal Problem:   Cholecystitis Active Problems:   Hyperlipidemia   Diabetes mellitus type 2, controlled ([place])   HTN (hypertension)   Alzheimer's disease   Acute gallstone pancreatitis   Abdominal pain    Results:   Labs: Results for orders placed or performed during the hospital encounter of 01/09/16 (from the past 48 hour(s))  Glucose, capillary     Status: None   Collection Time: 01/10/16  4:28 PM  Result Value Ref Range   Glucose-Capillary 98 65 - 99 mg/dL  Glucose, capillary     Status: Abnormal   Collection Time: 01/10/16 10:51 PM  Result Value Ref Range   Glucose-Capillary 108 (H) 65 - 99 mg/dL  Comprehensive metabolic panel     Status: Abnormal   Collection Time: 01/11/16  1:00 AM  Result Value Ref Range   Sodium 138 135 - 145 mmol/L   Potassium 3.3 (L) 3.5 - 5.1 mmol/L   Chloride 103 101 - 111 mmol/L   CO2 21 (L) 22 - 32 mmol/L   Glucose, Bld 143 (H) 65 - 99 mg/dL   BUN 9 6 - 20 mg/dL   Creatinine, Ser 0.64 0.44 - 1.00  mg/dL   Calcium 8.7 (L) 8.9 - 10.3 mg/dL   Total Protein 6.3 (L) 6.5 - 8.1 g/dL   Albumin 3.5 3.5 - 5.0 g/dL   AST 44 (H) 15 - 41 U/L   ALT 48 14 - 54 U/L   Alkaline Phosphatase 95 38 - 126 U/L   Total Bilirubin 1.2 0.3 - 1.2 mg/dL   GFR calc non Af Amer >60 >60 mL/min   GFR calc Af Amer >60 >60 mL/min    Comment: (NOTE) The eGFR has been calculated using the CKD EPI equation. This calculation has not been validated in all clinical situations. eGFR's persistently <60 mL/min signify possible Chronic Kidney Disease.    Anion gap 14 5 - 15  Lipase, blood     Status: None   Collection Time: 01/11/16  1:00 AM  Result Value Ref Range   Lipase 44 11 - 51 U/L  CBC     Status: None   Collection Time: 01/11/16  1:00 AM  Result Value Ref Range   WBC 10.5 4.0 - 10.5 K/uL   RBC 4.15 3.87 - 5.11 MIL/uL  Hemoglobin 12.8 12.0 - 15.0 g/dL   HCT 38.9 36.0 - 46.0 %   MCV 93.7 78.0 - 100.0 fL   MCH 30.8 26.0 - 34.0 pg   MCHC 32.9 30.0 - 36.0 g/dL   RDW 13.4 11.5 - 15.5 %   Platelets 322 150 - 400 K/uL  Glucose, capillary     Status: None   Collection Time: 01/11/16  8:01 AM  Result Value Ref Range   Glucose-Capillary 97 65 - 99 mg/dL  Glucose, capillary     Status: Abnormal   Collection Time: 01/11/16 12:09 PM  Result Value Ref Range   Glucose-Capillary 101 (H) 65 - 99 mg/dL  Glucose, capillary     Status: None   Collection Time: 01/11/16  4:19 PM  Result Value Ref Range   Glucose-Capillary 95 65 - 99 mg/dL  Glucose, capillary     Status: None   Collection Time: 01/11/16  8:53 PM  Result Value Ref Range   Glucose-Capillary 88 65 - 99 mg/dL   Comment 1 Notify RN   Comprehensive metabolic panel     Status: Abnormal   Collection Time: 01/11/16  9:30 PM  Result Value Ref Range   Sodium 135 135 - 145 mmol/L   Potassium 3.4 (L) 3.5 - 5.1 mmol/L   Chloride 99 (L) 101 - 111 mmol/L   CO2 25 22 - 32 mmol/L   Glucose, Bld 107 (H) 65 - 99 mg/dL   BUN 8 6 - 20 mg/dL   Creatinine, Ser 0.56  0.44 - 1.00 mg/dL   Calcium 8.8 (L) 8.9 - 10.3 mg/dL   Total Protein 6.7 6.5 - 8.1 g/dL   Albumin 3.6 3.5 - 5.0 g/dL   AST 40 15 - 41 U/L   ALT 43 14 - 54 U/L   Alkaline Phosphatase 107 38 - 126 U/L   Total Bilirubin 1.1 0.3 - 1.2 mg/dL   GFR calc non Af Amer >60 >60 mL/min   GFR calc Af Amer >60 >60 mL/min    Comment: (NOTE) The eGFR has been calculated using the CKD EPI equation. This calculation has not been validated in all clinical situations. eGFR's persistently <60 mL/min signify possible Chronic Kidney Disease.    Anion gap 11 5 - 15  CBC     Status: Abnormal   Collection Time: 01/11/16  9:30 PM  Result Value Ref Range   WBC 11.6 (H) 4.0 - 10.5 K/uL   RBC 3.88 3.87 - 5.11 MIL/uL   Hemoglobin 12.1 12.0 - 15.0 g/dL   HCT 36.2 36.0 - 46.0 %   MCV 93.3 78.0 - 100.0 fL   MCH 31.2 26.0 - 34.0 pg   MCHC 33.4 30.0 - 36.0 g/dL   RDW 13.1 11.5 - 15.5 %   Platelets 295 150 - 400 K/uL  Ammonia     Status: None   Collection Time: 01/11/16  9:30 PM  Result Value Ref Range   Ammonia 21 9 - 35 umol/L  CBC     Status: Abnormal   Collection Time: 01/12/16  4:29 AM  Result Value Ref Range   WBC 8.8 4.0 - 10.5 K/uL   RBC 3.68 (L) 3.87 - 5.11 MIL/uL   Hemoglobin 11.7 (L) 12.0 - 15.0 g/dL   HCT 34.6 (L) 36.0 - 46.0 %   MCV 94.0 78.0 - 100.0 fL   MCH 31.8 26.0 - 34.0 pg   MCHC 33.8 30.0 - 36.0 g/dL   RDW 13.3 11.5 - 15.5 %  Platelets 257 150 - 400 K/uL  Comprehensive metabolic panel     Status: Abnormal   Collection Time: 01/12/16  4:29 AM  Result Value Ref Range   Sodium 136 135 - 145 mmol/L   Potassium 3.1 (L) 3.5 - 5.1 mmol/L   Chloride 100 (L) 101 - 111 mmol/L   CO2 25 22 - 32 mmol/L   Glucose, Bld 95 65 - 99 mg/dL   BUN 8 6 - 20 mg/dL   Creatinine, Ser 0.68 0.44 - 1.00 mg/dL   Calcium 8.5 (L) 8.9 - 10.3 mg/dL   Total Protein 5.9 (L) 6.5 - 8.1 g/dL   Albumin 3.1 (L) 3.5 - 5.0 g/dL   AST 32 15 - 41 U/L   ALT 37 14 - 54 U/L   Alkaline Phosphatase 95 38 - 126 U/L    Total Bilirubin 1.1 0.3 - 1.2 mg/dL   GFR calc non Af Amer >60 >60 mL/min   GFR calc Af Amer >60 >60 mL/min    Comment: (NOTE) The eGFR has been calculated using the CKD EPI equation. This calculation has not been validated in all clinical situations. eGFR's persistently <60 mL/min signify possible Chronic Kidney Disease.    Anion gap 11 5 - 15  Lipase, blood     Status: None   Collection Time: 01/12/16  4:29 AM  Result Value Ref Range   Lipase 19 11 - 51 U/L  Glucose, capillary     Status: None   Collection Time: 01/12/16 12:21 PM  Result Value Ref Range   Glucose-Capillary 92 65 - 99 mg/dL    Imaging / Studies: No results found.  Medications / Allergies:  Scheduled Meds: . ALPRAZolam  0.25 mg Oral QHS  . aspirin  81 mg Oral Daily  . cholecalciferol  1,000 Units Oral Daily  . enalapril  20 mg Oral q morning - 10a  . enoxaparin (LOVENOX) injection  40 mg Subcutaneous QHS  . insulin aspart  0-9 Units Subcutaneous TID WC  . loratadine  10 mg Oral Daily  . memantine  21 mg Oral Daily  . mirtazapine  22.5 mg Oral QHS  . pantoprazole (PROTONIX) IV  40 mg Intravenous Q24H  . piperacillin-tazobactam (ZOSYN)  IV  3.375 g Intravenous 3 times per day  . potassium chloride  10 mEq Intravenous Q1 Hr x 4  . potassium chloride  40 mEq Oral Once  . QUEtiapine  50 mg Oral QHS  . rivastigmine  4.6 mg Transdermal Daily   Continuous Infusions: . sodium chloride 50 mL/hr at 01/12/16 0700   PRN Meds:.acetaminophen, albuterol, ALPRAZolam, morphine injection, ondansetron **OR** ondansetron (ZOFRAN) IV  Antibiotics: Anti-infectives    Start     Dose/Rate Route Frequency Ordered Stop   01/12/16 1400  piperacillin-tazobactam (ZOSYN) IVPB 3.375 g  Status:  Discontinued     3.375 g 100 mL/hr over 30 Minutes Intravenous 3 times per day 01/12/16 1212 01/12/16 1220   01/12/16 1400  piperacillin-tazobactam (ZOSYN) IVPB 3.375 g     3.375 g 12.5 mL/hr over 240 Minutes Intravenous 3 times per day  01/12/16 1220     01/11/16 2200  amoxicillin-clavulanate (AUGMENTIN) 875-125 MG per tablet 1 tablet  Status:  Discontinued     1 tablet Oral Every 12 hours 01/11/16 1519 01/12/16 1212   01/10/16 1400  piperacillin-tazobactam (ZOSYN) IVPB 3.375 g  Status:  Discontinued     3.375 g 12.5 mL/hr over 240 Minutes Intravenous 3 times per day 01/10/16 1036 01/10/16 1038  01/10/16 1400  piperacillin-tazobactam (ZOSYN) IVPB 3.375 g  Status:  Discontinued     3.375 g 12.5 mL/hr over 240 Minutes Intravenous 3 times per day 01/10/16 1134 01/11/16 1519        Assessment/Plan Gallstone pancreatitis  Cholecystitis  -lap chole with IOC later today.  Surgical risks discussed with POA Cathy and 2nd POA Kelly(daughter, at bedside) including infection, bleeding, injury to surrounding structures, open surgery, heart attack, stroke, respiratory compromise, worsening memory which was be prolonged.  Verbalize understanding and wish to proceed. ID-zosyn alzheimers-may acutely worsen post op.   FEN-had some jello at 10AM, NPO since.  Hypokalemia-give 44mq IV now, IVF Dispo-to OR   EErby Pian ANP-BC [facility] Pager [phone](7A-4:30P)   01/12/2016 12:59 PM

## 2016-01-12 NOTE — Care Management Important Message (Signed)
Important Message  Patient Details  Name: JULIETA ROGALSKI MRN: [id number] Date of Birth: 11/28/1940   Medicare Important Message Given:  Yes    Haskell Flirt 01/12/2016, 1:21 PMImportant Message  Patient Details  Name: HILDE CHURCHMAN MRN: [id number] Date of Birth: November 27, 1940   Medicare Important Message Given:  Yes    Haskell Flirt 01/12/2016, 1:20 PM

## 2016-01-12 NOTE — Anesthesia Postprocedure Evaluation (Signed)
Anesthesia Post Note  Patient: Amy [name]  Procedure(s) Performed: Procedure(s) (LRB): LAPAROSCOPIC CHOLECYSTECTOMY WITH INTRAOPERATIVE CHOLANGIOGRAM (N/A)  Patient location during evaluation: PACU Anesthesia Type: General Level of consciousness: sedated Pain management: satisfactory to patient Vital Signs Assessment: post-procedure vital signs reviewed and stable Respiratory status: spontaneous breathing Cardiovascular status: stable Anesthetic complications: no    Last Vitals:  Filed Vitals:   01/12/16 1730 01/12/16 1745  BP: 162/49   Pulse: 88 107  Temp:    Resp: 23 22    Last Pain:  Filed Vitals:   01/12/16 1749  PainSc: 1                  [name] EDWARD

## 2016-01-12 NOTE — Anesthesia Preprocedure Evaluation (Signed)
Anesthesia Evaluation  Patient identified by MRN, date of birth, ID band Patient awake    Reviewed: Allergy & Precautions, H&P , Patient's Chart, lab work & pertinent test results, reviewed documented beta blocker date and time   Airway Mallampati: II  TM Distance: >3 FB Neck ROM: full    Dental no notable dental hx.    Pulmonary former smoker,    Pulmonary exam normal breath sounds clear to auscultation       Cardiovascular hypertension,  Rhythm:regular Rate:Normal     Neuro/Psych    GI/Hepatic   Endo/Other  diabetes  Renal/GU      Musculoskeletal   Abdominal   Peds  Hematology   Anesthesia Other Findings   Reproductive/Obstetrics                             Anesthesia Physical Anesthesia Plan  ASA: II  Anesthesia Plan: General   Post-op Pain Management:    Induction: Intravenous  Airway Management Planned: Oral ETT  Additional Equipment:   Intra-op Plan:   Post-operative Plan: Extubation in OR  Informed Consent: I have reviewed the patients History and Physical, chart, labs and discussed the procedure including the risks, benefits and alternatives for the proposed anesthesia with the patient or authorized representative who has indicated his/her understanding and acceptance.   Dental Advisory Given and Dental advisory given  Plan Discussed with: CRNA and Surgeon  Anesthesia Plan Comments: (  Discussed general anesthesia, including possible nausea, instrumentation of airway, sore throat,pulmonary aspiration, etc. I asked if the were any outstanding questions, or  concerns before we proceeded.)        Anesthesia Quick Evaluation  

## 2016-01-12 NOTE — Anesthesia Procedure Notes (Signed)
Procedure Name: Intubation Date/Time: 01/12/2016 4:03 PM Performed by: Epimenio Sarin Pre-anesthesia Checklist: Patient identified, Emergency Drugs available, Suction available, Patient being monitored and Timeout performed Patient Re-evaluated:Patient Re-evaluated prior to inductionOxygen Delivery Method: Circle system utilized Preoxygenation: Pre-oxygenation with 100% oxygen Intubation Type: IV induction Ventilation: Mask ventilation without difficulty Laryngoscope Size: Mac and 3 Grade View: Grade I Tube type: Oral Tube size: 7.0 mm Number of attempts: 1 Airway Equipment and Method: Stylet Placement Confirmation: ETT inserted through vocal cords under direct vision,  positive ETCO2 and breath sounds checked- equal and bilateral Secured at: 21 cm Tube secured with: Tape Dental Injury: Teeth and Oropharynx as per pre-operative assessment

## 2016-01-12 NOTE — Transfer of Care (Signed)
Immediate Anesthesia Transfer of Care Note  Patient: Amy [name]  Procedure(s) Performed: Procedure(s): LAPAROSCOPIC CHOLECYSTECTOMY WITH INTRAOPERATIVE CHOLANGIOGRAM (N/A)  Patient Location: PACU  Anesthesia Type:General  Level of Consciousness:  sedated, patient cooperative and responds to stimulation  Airway & Oxygen Therapy:Patient Spontanous Breathing and Patient connected to face mask oxgen  Post-op Assessment:  Report given to PACU RN and Post -op Vital signs reviewed and stable  Post vital signs:  Reviewed and stable  Last Vitals:  Filed Vitals:   01/12/16 1411 01/12/16 1722  BP: 114/42   Pulse: 78 95  Temp:  36.8 C  Resp: 15 29    Complications: No apparent anesthesia complications

## 2016-01-13 LAB — GLUCOSE, CAPILLARY
GLUCOSE-CAPILLARY: 112 mg/dL — AB (ref 65–99)
GLUCOSE-CAPILLARY: 148 mg/dL — AB (ref 65–99)
GLUCOSE-CAPILLARY: 134 mg/dL — AB (ref 65–99)
Glucose-Capillary: 86 mg/dL (ref 65–99)

## 2016-01-13 LAB — COMPREHENSIVE METABOLIC PANEL
ALK PHOS: 101 U/L (ref 38–126)
ALT: 49 U/L (ref 14–54)
AST: 64 U/L — AB (ref 15–41)
Albumin: 3.1 g/dL — ABNORMAL LOW (ref 3.5–5.0)
Anion gap: 7 (ref 5–15)
BILIRUBIN TOTAL: 0.5 mg/dL (ref 0.3–1.2)
BUN: 9 mg/dL (ref 6–20)
CALCIUM: 8.4 mg/dL — AB (ref 8.9–10.3)
CO2: 26 mmol/L (ref 22–32)
CREATININE: 0.53 mg/dL (ref 0.44–1.00)
Chloride: 105 mmol/L (ref 101–111)
Glucose, Bld: 150 mg/dL — ABNORMAL HIGH (ref 65–99)
Potassium: 3.6 mmol/L (ref 3.5–5.1)
Sodium: 138 mmol/L (ref 135–145)
TOTAL PROTEIN: 6 g/dL — AB (ref 6.5–8.1)

## 2016-01-13 LAB — CBC
HEMATOCRIT: 33.8 % — AB (ref 36.0–46.0)
HEMOGLOBIN: 11.3 g/dL — AB (ref 12.0–15.0)
MCH: 31.6 pg (ref 26.0–34.0)
MCHC: 33.4 g/dL (ref 30.0–36.0)
MCV: 94.4 fL (ref 78.0–100.0)
PLATELETS: 250 10*3/uL (ref 150–400)
RBC: 3.58 MIL/uL — AB (ref 3.87–5.11)
RDW: 13.5 % (ref 11.5–15.5)
WBC: 5.2 10*3/uL (ref 4.0–10.5)

## 2016-01-13 MED ORDER — DIPHENHYDRAMINE HCL 50 MG/ML IJ SOLN
12.5000 mg | Freq: Once | INTRAMUSCULAR | Status: AC
  Administered 2016-01-13: 12.5 mg via INTRAMUSCULAR
  Filled 2016-01-13: qty 1

## 2016-01-13 MED ORDER — SERTRALINE HCL 25 MG PO TABS: 25.0000 mg | ORAL_TABLET | Freq: Every day | ORAL | Status: DC

## 2016-01-13 MED ORDER — OXYCODONE HCL 5 MG PO TABS
5.0000 mg | ORAL_TABLET | ORAL | Status: DC | PRN
  Administered 2016-01-13 – 2016-01-16 (×4): 5 mg via ORAL
  Filled 2016-01-13 (×4): qty 1

## 2016-01-13 MED ORDER — BUSPIRONE HCL 10 MG PO TABS
10.0000 mg | ORAL_TABLET | Freq: Three times a day (TID) | ORAL | Status: DC
  Filled 2016-01-13: qty 1

## 2016-01-13 MED ORDER — HALOPERIDOL LACTATE 5 MG/ML IJ SOLN
1.0000 mg | Freq: Once | INTRAMUSCULAR | Status: AC
  Administered 2016-01-13: 1 mg via INTRAMUSCULAR
  Filled 2016-01-13: qty 1

## 2016-01-13 MED ORDER — SACCHAROMYCES BOULARDII 250 MG PO CAPS
250.0000 mg | ORAL_CAPSULE | Freq: Every day | ORAL | Status: DC
  Administered 2016-01-14 – 2016-01-17 (×4): 250 mg via ORAL
  Filled 2016-01-13 (×5): qty 1

## 2016-01-13 MED ORDER — POTASSIUM CHLORIDE 20 MEQ/15ML (10%) PO SOLN
40.0000 meq | Freq: Once | ORAL | Status: AC
  Administered 2016-01-13: 40 meq via ORAL
  Filled 2016-01-13: qty 30

## 2016-01-13 NOTE — Progress Notes (Signed)
Pt suddenly became very confused and agitated with staff.  Pt climbed out of bed with safety sitter in room and pulled out her IV line.  Pt was screaming "help, help" while swinging her hands at the staff.  It took multiple nurses and techs to try and calm pt down and get her to sit in recliner in room.  MD notified and new orders written and carried out.  Order for soft waist restraint initiated.  Pt sat up and ate her lunch.  Pt then began yelling out again and tried to get out of recliner while trying to hit staff. Pt helped back to bed and restraint reapplied. Pt given IM benadryl and haldol per order.

## 2016-01-13 NOTE — Progress Notes (Signed)
1 Day Post-Op  Subjective: Having some pain.  Eating soft diet.  Objective: Vital signs in last 24 hours: Temp:  [97.6 F (36.4 C)-99.5 F (37.5 C)] 97.6 F (36.4 C) (02/18 0500) Pulse Rate:  [75-107] 80 (02/18 0500) Resp:  [15-29] 18 (02/18 0500) BP: (99-172)/(42-97) 172/65 mmHg (02/18 0500) SpO2:  [90 %-100 %] 100 % (02/18 0500) Last BM Date: 01/10/16  Intake/Output from previous day: 02/17 0701 - 02/18 0700 In: 1550 [P.O.:50; I.V.:1400; IV Piggyback:100] Out: 1785 [Urine:1775; Blood:10] Intake/Output this shift:    PE: General- In NAD Abdomen-soft, incisions clean and intact  Lab Results:   Recent Labs  01/12/16 0429 01/13/16 0517  WBC 8.8 5.2  HGB 11.7* 11.3*  HCT 34.6* 33.8*  PLT 257 250   BMET  Recent Labs  01/12/16 0429 01/13/16 0517  NA 136 138  K 3.1* 3.6  CL 100* 105  CO2 25 26  GLUCOSE 95 150*  BUN 8 9  CREATININE 0.68 0.53  CALCIUM 8.5* 8.4*   PT/INR No results for input(s): LABPROT, INR in the last 72 hours. Comprehensive Metabolic Panel:    Component Value Date/Time   NA 138 01/13/2016 0517   NA 136 01/12/2016 0429   NA 139 08/23/2015 1459   K 3.6 01/13/2016 0517   K 3.1* 01/12/2016 0429   CL 105 01/13/2016 0517   CL 100* 01/12/2016 0429   CO2 26 01/13/2016 0517   CO2 25 01/12/2016 0429   BUN 9 01/13/2016 0517   BUN 8 01/12/2016 0429   BUN 12 08/23/2015 1459   CREATININE 0.53 01/13/2016 0517   CREATININE 0.68 01/12/2016 0429   CREATININE 0.68 06/16/2014 1013   CREATININE 0.67 03/16/2014 1112   GLUCOSE 150* 01/13/2016 0517   GLUCOSE 95 01/12/2016 0429   GLUCOSE 185* 08/23/2015 1459   CALCIUM 8.4* 01/13/2016 0517   CALCIUM 8.5* 01/12/2016 0429   AST 64* 01/13/2016 0517   AST 32 01/12/2016 0429   ALT 49 01/13/2016 0517   ALT 37 01/12/2016 0429   ALKPHOS 101 01/13/2016 0517   ALKPHOS 95 01/12/2016 0429   BILITOT 0.5 01/13/2016 0517   BILITOT 1.1 01/12/2016 0429   BILITOT <0.2 08/23/2015 1459   PROT 6.0* 01/13/2016 0517    PROT 5.9* 01/12/2016 0429   PROT 6.7 08/23/2015 1459   ALBUMIN 3.1* 01/13/2016 0517   ALBUMIN 3.1* 01/12/2016 0429   ALBUMIN 4.5 08/23/2015 1459     Studies/Results: Dg Cholangiogram Operative  01/12/2016  CLINICAL DATA:  Calculus cholecystitis EXAM: INTRAOPERATIVE CHOLANGIOGRAM TECHNIQUE: Cholangiographic images from the C-arm fluoroscopic device were submitted for interpretation post-operatively. Please see the procedural report for the amount of contrast and the fluoroscopy time utilized. COMPARISON:  01/09/2016 FINDINGS: Intraoperative cholangiogram performed during the laparoscopic cholecystectomy. The cystic duct, biliary confluence, common hepatic duct, and common bile duct are patent. Contrast drains into the duodenum. No dilatation or obstruction. IMPRESSION: Patent biliary system. Electronically Signed   By: Judie Petit.  Shick M.D.   On: 01/12/2016 17:03    Anti-infectives: Anti-infectives    Start     Dose/Rate Route Frequency Ordered Stop   01/12/16 2300  piperacillin-tazobactam (ZOSYN) IVPB 3.375 g     3.375 g 12.5 mL/hr over 240 Minutes Intravenous 3 times per day 01/12/16 1813 01/13/16 0922   01/12/16 1400  piperacillin-tazobactam (ZOSYN) IVPB 3.375 g  Status:  Discontinued     3.375 g 100 mL/hr over 30 Minutes Intravenous 3 times per day 01/12/16 1212 01/12/16 1220   01/12/16 1400  piperacillin-tazobactam (  ZOSYN) IVPB 3.375 g  Status:  Discontinued     3.375 g 12.5 mL/hr over 240 Minutes Intravenous 3 times per day 01/12/16 1220 01/12/16 1813   01/11/16 2200  amoxicillin-clavulanate (AUGMENTIN) 875-125 MG per tablet 1 tablet  Status:  Discontinued     1 tablet Oral Every 12 hours 01/11/16 1519 01/12/16 1212   01/10/16 1400  piperacillin-tazobactam (ZOSYN) IVPB 3.375 g  Status:  Discontinued     3.375 g 12.5 mL/hr over 240 Minutes Intravenous 3 times per day 01/10/16 1036 01/10/16 1038   01/10/16 1400  piperacillin-tazobactam (ZOSYN) IVPB 3.375 g  Status:  Discontinued      3.375 g 12.5 mL/hr over 240 Minutes Intravenous 3 times per day 01/10/16 1134 01/11/16 1519      Assessment Principal Problem:   Gallstone pancreatitis s/p lap chole with IOC 01/12/16-stable overnight Active Problems:   Diabetes mellitus type 2, controlled (HCC)   HTN (hypertension)   Alzheimer's disease       LOS: 4 days   Plan: Ok for discharge from surgical standpoint when medically stable.  Follow up in office in 2-3 weeks.   Amy [name] 01/13/2016

## 2016-01-13 NOTE — Discharge Instructions (Addendum)
Lowfat diet.  No lifting over 10 pounds for 2 weeks.  No strenuous activity for 2 weeks.   Call for wound problems, high fever (>101.5), persistent vomiting.  May shower beginning tomorrow.  LAPAROSCOPIC SURGERY: POST OP INSTRUCTIONS  1. DIET: Follow a light bland diet the first 24 hours after arrival home, such as soup, liquids, crackers, etc.  Be sure to include lots of fluids daily.  Avoid fast food or heavy meals as your are more likely to get nauseated.  Eat a low fat the next few days after surgery.   2. Take your usually prescribed home medications unless otherwise directed. 3. PAIN CONTROL: a. Pain is best controlled by a usual combination of three different methods TOGETHER: i. Ice/Heat ii. Over the counter pain medication iii. Prescription pain medication b. Most patients will experience some swelling and bruising around the incisions.  Ice packs or heating pads (30-60 minutes up to 6 times a day) will help. Use ice for the first few days to help decrease swelling and bruising, then switch to heat to help relax tight/sore spots and speed recovery.  Some people prefer to use ice alone, heat alone, alternating between ice & heat.  Experiment to what works for you.  Swelling and bruising can take several weeks to resolve.   c. It is helpful to take an over-the-counter pain medication regularly for the first few weeks.  Choose one of the following that works best for you: i. Naproxen (Aleve, etc)  Two  tabs twice a day ii. Ibuprofen (Advil, etc) Three  tabs four times a day (every meal & bedtime) iii. Acetaminophen (Tylenol, etc) 500-650mg  four times a day (every meal & bedtime) d. A  prescription for pain medication (such as oxycodone, hydrocodone, etc) should be given to you upon discharge.  Take your pain medication as prescribed.  i. If you are having problems/concerns with the prescription medicine (does not control pain, nausea, vomiting, rash, itching, etc), please call  us [phone] to see if we need to switch you to a different pain medicine that will work better for you and/or control your side effect better. ii. If you need a refill on your pain medication, please contact your pharmacy.  They will contact our office to request authorization. Prescriptions will not be filled after 5 pm or on week-ends. 4. Avoid getting constipated.  Between the surgery and the pain medications, it is common to experience some constipation.  Increasing fluid intake and taking a fiber supplement (such as Metamucil, Citrucel, FiberCon, MiraLax, etc) 1-2 times a day regularly will usually help prevent this problem from occurring.  A mild laxative (prune juice, Milk of Magnesia, MiraLax, etc) should be taken according to package directions if there are no bowel movements after 48 hours.   5. Watch out for diarrhea.  If you have many loose bowel movements, simplify your diet to bland foods & liquids for a few days.  Stop any stool softeners and decrease your fiber supplement.  Switching to mild anti-diarrheal medications (Kayopectate, Pepto Bismol) can help.  If this worsens or does not improve, please call us. 6. Wash / shower every day.  You may shower over the dressings as they are waterproof.  Continue to shower over incision(s) after the dressing is off. 7. Remove your waterproof bandages 5 days after surgery.  You may leave the incision open to air.  You may replace a dressing/Band-Aid to cover the incision for comfort if you wish.  8. ACTIVITIES as  tolerated:   a. You may resume regular (light) daily activities beginning the next day--such as daily self-care, walking, climbing stairs--gradually increasing activities as tolerated.  If you can walk 30 minutes without difficulty, it is safe to try more intense activity such as jogging, treadmill, bicycling, low-impact aerobics, swimming, etc. b. Save the most intensive and strenuous activity for last such as sit-ups, heavy lifting,  contact sports, etc  Refrain from any heavy lifting or straining until you are off narcotics for pain control.   c. DO NOT PUSH THROUGH PAIN.  Let pain be your guide: If it hurts to do something, don't do it.  Pain is your body warning you to avoid that activity for another week until the pain goes down. d. You may drive when you are no longer taking prescription pain medication, you can comfortably wear a seatbelt, and you can safely maneuver your car and apply brakes. e. Bonita Quin may have sexual intercourse when it is comfortable.  9. FOLLOW UP in our office a. Please call [facility] at [phone] to set up an appointment to see your surgeon in the office for a follow-up appointment approximately 2-3 weeks after your surgery. b. Make sure that you call for this appointment the day you arrive home to insure a convenient appointment time. 10. IF YOU HAVE DISABILITY OR FAMILY LEAVE FORMS, BRING THEM TO THE OFFICE FOR PROCESSING.  DO NOT GIVE THEM TO YOUR DOCTOR.   WHEN TO CALL us [phone]: 1. Poor pain control 2. Reactions / problems with new medications (rash/itching, nausea, etc)  3. Fever over 101.5 F (38.5 C) 4. Inability to urinate 5. Nausea and/or vomiting 6. Worsening swelling or bruising 7. Continued bleeding from incision. 8. Increased pain, redness, or drainage from the incision   The clinic staff is available to answer your questions during regular business hours (8:30am-5pm).  Please dont hesitate to call and ask to speak to one of our nurses for clinical concerns.   If you have a medical emergency, go to the nearest emergency room or call 911.  A surgeon from [facility] is always on call at the [facility], [place] [address], [address], [place], [place]  [address] ? MAIN: [phone] ? TOLL FREE: [phone] ?  FAX [phone] [email or web address]

## 2016-01-13 NOTE — Progress Notes (Signed)
[facility] PROGRESS NOTE  Amy [name] WUJ:[id number] DOB: 12/27/1940 DOA: 01/09/2016 PCP: Tommy Rainwater, MD  Summary 01/10/16: Amy [name] seen and examined Amy [name] at bedside in the presence of family members including her daughter who is the power of attorney, and reviewed her chart. Appreciate general surgery. Patient has acute cholecystitis with pancreatitis. Unfortunately, she has advanced dementia therefore cannot give a meaningful history. She was admitted earlier today by Dr. Katrinka Blazing. Please refer to his comprehensive H&P for details. Patient will need cholecystectomy. Will follow general surgery recommendations. 01/11/16: Appreciate general surgery. Patient has gallstone pancreatitis with cholecystitis. Pancreatitis has resolved pretty quickly with lipase now in normal range from a level greater than 3000 at admission. Patient states she is hungry. Noted discussions regarding holding off on surgery. Family will continue to reevaluate, understanding that patient will have chronic infection if she does not undergo cholecystectomy. We'll therefore start clear liquid diet and transition her to oral antibiotics to see how she does. 01/12/16: Had fever last night after she was switched to Augmentin. This is obviously concerning. Family has decided to proceed with cholecystectomy after consulting with patient's primary care provider. Will inform general surgery-spoke with Dr. Sheliah Hatch. Appreciate help. Meanwhile, will change antibiotics back to Zosyn. Fortunately, lipase remains normal. Patient otherwise lethargic. Discussed with her daughter Amy [name] and sister Amy [name]. 01/13/16: Appreciate general surgery. Patient now status post cholecystectomy(01/12/16). More with it but agitated. Will monitor over the weekend and plan return to skilled nursing facility early next week, likely Monday. We'll give Haldol/Benadryl as needed for agitation. Discussed with patient's daughter Amy [name] over the  phone. Plan Cholecystitis/Acute gallstone pancreatitis/Abdominal pain  Now off antibiotics  Continue IV fluids  Follow general surgery recommendations Hypokalemia  Likely related to poor intake  Replenish electrolytes as necessary Hyperlipidemia/Diabetes mellitus type 2, controlled (HCC)/HTN (hypertension)/Alzheimer's disease  Generally controlled  Continue current medications, including most of all medications   Code Status: DNR/DNI Family Communication: Daughter at bedside, sister over the phone Disposition Plan: ?Back to SNF post op next week   Consultants:  General surgery  Procedures:  None  Antibiotics:  Zosyn 01/10/2016>> 01/11/2016, 01/12/16>> 01/13/2016  Augmentin 01/11/1616>>01/12/16  HPI/Subjective: Agitated but more responsive verbally.  Objective: Filed Vitals:   01/13/16 0937 01/13/16 1400  BP: 136/74   Pulse: 79 73  Temp: 97.8 F (36.6 C) 97.8 F (36.6 C)  Resp: 18 18    Intake/Output Summary (Last 24 hours) at 01/13/16 1848 Last data filed at 01/13/16 1740  Gross per 24 hour  Intake    580 ml  Output   3075 ml  Net  -2495 ml   Filed Weights   01/10/16 1700  Weight: 68.04 kg (150 lb)    Exam:   General:  Comfortable at rest.  Cardiovascular: S1-S2 normal. No murmurs. Pulse regular.  Respiratory: Good air entry bilaterally. No rhonchi or rales.  Abdomen: Soft and nontender. Normal bowel sounds. No organomegaly.  Musculoskeletal: No pedal edema   Neurological: Agitated  Data Reviewed: Basic Metabolic Panel:  Recent Labs Lab 01/10/16 0433 01/11/16 0100 01/11/16 2130 01/12/16 0429 01/13/16 0517  NA 141 138 135 136 138  K 3.7 3.3* 3.4* 3.1* 3.6  CL 106 103 99* 100* 105  CO2 25 21* 25 25 26   GLUCOSE 122* 143* 107* 95 150*  BUN 13 9 8 8 9   CREATININE 0.63 0.64 0.56 0.68 0.53  CALCIUM 8.8* 8.7* 8.8* 8.5* 8.4*   Liver Function Tests:  Recent Labs Lab 01/10/16 0433 01/11/16 0100  01/11/16 2130 01/12/16 0429  01/13/16 0517  AST 79* 44* 40 32 64*  ALT 68* 48 43 37 49  ALKPHOS 95 95 107 95 101  BILITOT 0.7 1.2 1.1 1.1 0.5  PROT 6.0* 6.3* 6.7 5.9* 6.0*  ALBUMIN 3.7 3.5 3.6 3.1* 3.1*    Recent Labs Lab 01/09/16 1840 01/10/16 0433 01/11/16 0100 01/12/16 0429  LIPASE >3000* 275* 44 19    Recent Labs Lab 01/11/16 2130  AMMONIA 21   CBC:  Recent Labs Lab 01/09/16 1840 01/10/16 0433 01/11/16 0100 01/11/16 2130 01/12/16 0429 01/13/16 0517  WBC 12.2* 8.0 10.5 11.6* 8.8 5.2  NEUTROABS 9.5*  --   --   --   --   --   HGB 12.1 12.3 12.8 12.1 11.7* 11.3*  HCT 37.6 37.6 38.9 36.2 34.6* 33.8*  MCV 95.7 94.9 93.7 93.3 94.0 94.4  PLT 349 319 322 295 257 250   Cardiac Enzymes: No results for input(s): CKTOTAL, CKMB, CKMBINDEX, TROPONINI in the last 168 hours. BNP (last 3 results) No results for input(s): BNP in the last 8760 hours.  ProBNP (last 3 results) No results for input(s): PROBNP in the last 8760 hours.  CBG:  Recent Labs Lab 01/12/16 1702 01/12/16 1723 01/12/16 2039 01/13/16 0928 01/13/16 1209  GLUCAP 96 110* 265* 112* 148*    Recent Results (from the past 240 hour(s))  Urine culture     Status: None   Collection Time: 01/09/16  6:34 PM  Result Value Ref Range Status   Specimen Description URINE, CLEAN CATCH  Final   Special Requests NONE  Final   Culture   Final    1,000 COLONIES/mL INSIGNIFICANT GROWTH Performed at [facility]    Report Status 01/11/2016 FINAL  Final  Culture, blood (Routine X 2) w Reflex to ID Panel     Status: None (Preliminary result)   Collection Time: 01/11/16  9:21 PM  Result Value Ref Range Status   Specimen Description BLOOD LEFT HAND  Final   Special Requests IN PEDIATRIC BOTTLE 2CC  Final   Culture   Final    NO GROWTH 1 DAY Performed at [facility]    Report Status PENDING  Incomplete  Culture, blood (Routine X 2) w Reflex to ID Panel     Status: None (Preliminary result)   Collection Time: 01/11/16  9:30  PM  Result Value Ref Range Status   Specimen Description BLOOD RIGHT ANTECUBITAL  Final   Special Requests BOTTLES DRAWN AEROBIC AND ANAEROBIC 5CC  Final   Culture   Final    NO GROWTH 1 DAY Performed at [facility]    Report Status PENDING  Incomplete  Surgical pcr screen     Status: None   Collection Time: 01/12/16  2:01 PM  Result Value Ref Range Status   MRSA, PCR NEGATIVE NEGATIVE Final   Staphylococcus aureus NEGATIVE NEGATIVE Final    Comment:        The Xpert SA Assay (FDA approved for NASAL specimens in patients over 6 years of age), is one component of a comprehensive surveillance program.  Test performance has been validated by [facility] for patients greater than or equal to 71 year old. It is not intended to diagnose infection nor to guide or monitor treatment.      Studies: Dg Cholangiogram Operative  01/12/2016  CLINICAL DATA:  Calculus cholecystitis EXAM: INTRAOPERATIVE CHOLANGIOGRAM TECHNIQUE: Cholangiographic images from the C-arm fluoroscopic device were submitted for interpretation post-operatively. Please  see the procedural report for the amount of contrast and the fluoroscopy time utilized. COMPARISON:  01/09/2016 FINDINGS: Intraoperative cholangiogram performed during the laparoscopic cholecystectomy. The cystic duct, biliary confluence, common hepatic duct, and common bile duct are patent. Contrast drains into the duodenum. No dilatation or obstruction. IMPRESSION: Patent biliary system. Electronically Signed   By: Judie Petit.  Shick M.D.   On: 01/12/2016 17:03    Scheduled Meds: . ALPRAZolam  0.25 mg Oral QHS  . aspirin  81 mg Oral Daily  . busPIRone  10 mg Oral TID  . cholecalciferol  1,000 Units Oral Daily  . enalapril  20 mg Oral q morning - 10a  . enoxaparin (LOVENOX) injection  40 mg Subcutaneous QHS  . insulin aspart  0-9 Units Subcutaneous TID WC  . loratadine  10 mg Oral Daily  . memantine  21 mg Oral Daily  . mirtazapine  22.5 mg Oral QHS   . pantoprazole (PROTONIX) IV  40 mg Intravenous Q24H  . potassium chloride  40 mEq Oral Once  . QUEtiapine  50 mg Oral QHS  . rivastigmine  4.6 mg Transdermal Daily  . saccharomyces boulardii  250 mg Oral Daily  . sertraline  25 mg Oral Daily   Continuous Infusions:    Time spent: 20 minutes    Anahli Arvanitis  [facility] Pager [phone]. If 7PM-7AM, please contact night-coverage at [email or web address], password [facility] 01/13/2016, 6:48 PM  LOS: 4 days

## 2016-01-14 LAB — CBC WITH DIFFERENTIAL/PLATELET
BASOS ABS: 0 10*3/uL (ref 0.0–0.1)
BASOS PCT: 1 %
EOS PCT: 2 %
Eosinophils Absolute: 0.1 10*3/uL (ref 0.0–0.7)
HCT: 33.1 % — ABNORMAL LOW (ref 36.0–46.0)
Hemoglobin: 10.9 g/dL — ABNORMAL LOW (ref 12.0–15.0)
LYMPHS PCT: 12 %
Lymphs Abs: 0.5 10*3/uL — ABNORMAL LOW (ref 0.7–4.0)
MCH: 31.2 pg (ref 26.0–34.0)
MCHC: 32.9 g/dL (ref 30.0–36.0)
MCV: 94.8 fL (ref 78.0–100.0)
Monocytes Absolute: 0.5 10*3/uL (ref 0.1–1.0)
Monocytes Relative: 12 %
NEUTROS ABS: 3.1 10*3/uL (ref 1.7–7.7)
Neutrophils Relative %: 73 %
PLATELETS: 228 10*3/uL (ref 150–400)
RBC: 3.49 MIL/uL — AB (ref 3.87–5.11)
RDW: 13.4 % (ref 11.5–15.5)
WBC: 4.2 10*3/uL (ref 4.0–10.5)

## 2016-01-14 LAB — COMPREHENSIVE METABOLIC PANEL
ALBUMIN: 3.3 g/dL — AB (ref 3.5–5.0)
ALK PHOS: 91 U/L (ref 38–126)
ALT: 46 U/L (ref 14–54)
ANION GAP: 9 (ref 5–15)
AST: 50 U/L — ABNORMAL HIGH (ref 15–41)
BILIRUBIN TOTAL: 0.7 mg/dL (ref 0.3–1.2)
BUN: 12 mg/dL (ref 6–20)
CALCIUM: 8.3 mg/dL — AB (ref 8.9–10.3)
CO2: 28 mmol/L (ref 22–32)
CREATININE: 0.62 mg/dL (ref 0.44–1.00)
Chloride: 101 mmol/L (ref 101–111)
Glucose, Bld: 135 mg/dL — ABNORMAL HIGH (ref 65–99)
Potassium: 3.5 mmol/L (ref 3.5–5.1)
Sodium: 138 mmol/L (ref 135–145)
TOTAL PROTEIN: 6.3 g/dL — AB (ref 6.5–8.1)

## 2016-01-14 LAB — GLUCOSE, CAPILLARY
GLUCOSE-CAPILLARY: 116 mg/dL — AB (ref 65–99)
GLUCOSE-CAPILLARY: 128 mg/dL — AB (ref 65–99)
Glucose-Capillary: 118 mg/dL — ABNORMAL HIGH (ref 65–99)
Glucose-Capillary: 103 mg/dL — ABNORMAL HIGH (ref 65–99)

## 2016-01-14 LAB — MAGNESIUM: MAGNESIUM: 1.5 mg/dL — AB (ref 1.7–2.4)

## 2016-01-14 MED ORDER — PANTOPRAZOLE SODIUM 40 MG PO TBEC
40.0000 mg | DELAYED_RELEASE_TABLET | Freq: Every day | ORAL | Status: DC
  Administered 2016-01-15 – 2016-01-17 (×3): 40 mg via ORAL
  Filled 2016-01-14 (×3): qty 1

## 2016-01-14 MED ORDER — BISACODYL 10 MG RE SUPP: 10.0000 mg | Freq: Every day | RECTAL | Status: DC | PRN

## 2016-01-14 MED ORDER — AMOXICILLIN-POT CLAVULANATE 875-125 MG PO TABS
1.0000 | ORAL_TABLET | Freq: Two times a day (BID) | ORAL | Status: DC
  Administered 2016-01-14 – 2016-01-17 (×6): 1 via ORAL
  Filled 2016-01-14 (×8): qty 1

## 2016-01-14 MED ORDER — POTASSIUM CHLORIDE 20 MEQ/15ML (10%) PO SOLN
40.0000 meq | Freq: Once | ORAL | Status: DC
  Filled 2016-01-14: qty 30

## 2016-01-14 MED ORDER — PIPERACILLIN-TAZOBACTAM 3.375 G IVPB
3.3750 g | Freq: Three times a day (TID) | INTRAVENOUS | Status: DC
  Filled 2016-01-14 (×2): qty 50

## 2016-01-14 NOTE — Clinical Social Work Note (Signed)
Clinical Social Work Assessment  Patient Details  Name: Amy [name] MRN: [id number] Date of Birth: 27-Feb-1941  Date of referral:  01/14/16               Reason for consult:  Discharge Planning, Facility Placement                Permission sought to share information with:  Facility Medical sales representative, Family Supports Permission granted to share information::  Yes, Verbal Permission Granted  Name::     [employer]::  [facility]  Relationship::     Contact Information:     Housing/Transportation Living arrangements for the past 2 months:  Assisted Dealer of Information:  Adult Children Patient Interpreter Needed:  None Criminal Activity/Legal Involvement Pertinent to Current Situation/Hospitalization:  No - Comment as needed Significant Relationships:  Adult Children Lives with:  Facility Resident Do you feel safe going back to the place where you live?  Yes Need for family participation in patient care:  Yes (Comment)  Care giving concerns:  Patient's daughter does not report any care giving concerns.   Social Worker assessment / plan:  CSW spoke with patient's daughter Tresa Endo by phone to complete assessment as no family currently at bedside and patient is confused. Per Tresa Endo, the patient is a resident of [facility] and plans to return to the facility at discharge. CSW explained CSW role and process of getting the patient back to the facility. CSW will assist with DC when appropriate.   Employment status:  Disabled (Comment on whether or not currently receiving Disability), Retired Database administrator PT Recommendations:  Not assessed at this time Information / Referral to community resources:  Other (Comment Required) (Information will be sent to [facility] memory care)  Patient/Family's Response to care:  Patient's daughter is happy with the care the patient has received at [facility] and is  appreciative of CSW's assistance.  Patient/Family's Understanding of and Emotional Response to Diagnosis, Current Treatment, and Prognosis:  Patient's daughter Tresa Endo has a good understanding of the reason for the patient's admission and post DC needs.   Emotional Assessment Appearance:  Appears stated age Attitude/Demeanor/Rapport:  Unable to Assess Affect (typically observed):  Unable to Assess Orientation:  Oriented to Self Alcohol / Substance use:  Never Used Psych involvement (Current and /or in the community):  No (Comment)  Discharge Needs  Concerns to be addressed:  Discharge Planning Concerns Readmission within the last 30 days:  No Current discharge risk:  Cognitively Impaired, Chronically ill Barriers to Discharge:  Continued Medical Work up   Venita Lick, LCSW 01/14/2016, 12:50 PM

## 2016-01-14 NOTE — Progress Notes (Signed)
Dr. Venetia Constable aware pt has no IV access. Pt pulled out on previous shift. Antibiotics reordered po per MD.

## 2016-01-14 NOTE — [place] FL2 (Signed)
[place] MEDICAID FL2 LEVEL OF CARE SCREENING TOOL     IDENTIFICATION  Patient Name: Amy [name] Birthdate: 05/09/1941 Sex: female Admission Date (Current Location): 01/09/2016  [facility] and [place] Number:  Producer, television/film/video and Address:  [facility],  [address], [place] [address]      Provider Number: [id number]  Attending Physician Name and Address:  Conley Canal, MD  Relative Name and Phone Number:       Current Level of Care: Hospital Recommended Level of Care: Memory Care Prior Approval Number:    Date Approved/Denied:   PASRR Number:    Discharge Plan: Other (Comment) (Memory Care)    Current Diagnoses: Patient Active Problem List   Diagnosis Date Noted  . Acute gallstone pancreatitis 01/10/2016  . Abdominal pain 01/10/2016  . Cholecystitis 01/09/2016  . Alzheimer's disease 02/22/2015  . Encounter for long-term (current) use of other medications 03/16/2014  . SDAT 03/16/2014  . Vitamin D Deficiency 03/16/2014  . Diabetes mellitus type 2, controlled (HCC)   . HTN (hypertension)   . Hyperlipidemia 09/29/2009    Orientation RESPIRATION BLADDER Height & Weight     Self  Normal Incontinent Weight: 68.04 kg (150 lb) Height:   (167.6 cm)  BEHAVIORAL SYMPTOMS/MOOD NEUROLOGICAL BOWEL NUTRITION STATUS     (NONE) Continent Diet (Regular)  AMBULATORY STATUS COMMUNICATION OF NEEDS Skin   Limited Assist Verbally Surgical wounds (Abdomincal incision, 4 ports - 1. Umbilicus, 2. Mid;upper, 3. Right;Lateral;Upper, 4. Right;lateral;superior.)                       Personal Care Assistance Level of Assistance  Bathing, Feeding, Dressing Bathing Assistance: Limited assistance Feeding assistance: Limited assistance Dressing Assistance: Limited assistance     Functional Limitations Info  Hearing, Sight, Speech Sight Info: Adequate Hearing Info: Impaired Speech Info: Adequate    SPECIAL CARE FACTORS FREQUENCY                       Contractures Contractures Info: Not present    Additional Factors Info  Code Status, Allergies, Psychotropic, Insulin Sliding Scale Code Status Info: Full  Allergies Info: Latex, Lipitor, Shellfish-derived Products Psychotropic Info: Exelon, Seroquel, Remeron, Namenda, Xanax Insulin Sliding Scale Info: 3/day       Current Medications (01/14/2016):  This is the current hospital active medication list Current Facility-Administered Medications  Medication Dose Route Frequency Provider Last Rate Last Dose  . acetaminophen (TYLENOL) tablet 650 mg  650 mg Oral Q4H PRN Clydie Braun, MD   650 mg at 01/11/16 2105  . albuterol (PROVENTIL) (2.5 MG/3ML) 0.083% nebulizer solution 2.5 mg  2.5 mg Nebulization Q2H PRN Clydie Braun, MD      . ALPRAZolam Prudy Feeler) tablet 0.25 mg  0.25 mg Oral Q4H PRN Clydie Braun, MD   0.25 mg at 01/14/16 1238  . ALPRAZolam Prudy Feeler) tablet 0.25 mg  0.25 mg Oral QHS Gerome Apley [place], PA-C   0.25 mg at 01/13/16 2002  . aspirin chewable tablet 81 mg  81 mg Oral Daily Clydie Braun, MD   81 mg at 01/14/16 0946  . bisacodyl (DULCOLAX) suppository 10 mg  10 mg Rectal Daily PRN Simbiso Ranga, MD      . cholecalciferol (VITAMIN D) tablet 1,000 Units  1,000 Units Oral Daily Clydie Braun, MD   1,000 Units at 01/14/16 0946  . enalapril (VASOTEC) tablet 20 mg  20 mg Oral q morning -  10a Clydie Braun, MD   20 mg at 01/14/16 0946  . enoxaparin (LOVENOX) injection 40 mg  40 mg Subcutaneous QHS Clydie Braun, MD   40 mg at 01/12/16 2029  . insulin aspart (novoLOG) injection 0-9 Units  0-9 Units Subcutaneous TID WC Clydie Braun, MD   1 Units at 01/14/16 1238  . loratadine (CLARITIN) tablet 10 mg  10 mg Oral Daily Clydie Braun, MD   10 mg at 01/14/16 0946  . memantine (NAMENDA XR) 24 hr capsule 21 mg  21 mg Oral Daily Clydie Braun, MD   21 mg at 01/14/16 0946  . mirtazapine (REMERON) tablet 22.5 mg  22.5 mg Oral QHS Clydie Braun, MD   22.5 mg at 01/13/16  2002  . morphine 2 MG/ML injection 1 mg  1 mg Intravenous Q2H PRN Clydie Braun, MD   1 mg at 01/13/16 0925  . ondansetron (ZOFRAN) tablet 4 mg  4 mg Oral Q6H PRN Clydie Braun, MD       Or  . ondansetron (ZOFRAN) injection 4 mg  4 mg Intravenous Q6H PRN Clydie Braun, MD      . oxyCODONE (Oxy IR/ROXICODONE) immediate release tablet 5 mg  5 mg Oral Q4H PRN Simbiso Ranga, MD   5 mg at 01/14/16 0406  . pantoprazole (PROTONIX) EC tablet 40 mg  40 mg Oral Daily Simbiso Ranga, MD   40 mg at 01/14/16 1000  . piperacillin-tazobactam (ZOSYN) IVPB 3.375 g  3.375 g Intravenous 3 times per day Simbiso Ranga, MD      . potassium chloride 20 MEQ/15ML (10%) solution 40 mEq  40 mEq Oral Once Simbiso Ranga, MD      . QUEtiapine (SEROQUEL) tablet 50 mg  50 mg Oral QHS Clydie Braun, MD   50 mg at 01/13/16 2002  . rivastigmine (EXELON) 4.6 mg/24hr 4.6 mg  4.6 mg Transdermal Daily Clydie Braun, MD   4.6 mg at 01/14/16 1000  . saccharomyces boulardii (FLORASTOR) capsule 250 mg  250 mg Oral Daily Simbiso Ranga, MD   250 mg at 01/14/16 1610     Discharge Medications: Please see discharge summary for a list of discharge medications.  Relevant Imaging Results:  Relevant Lab Results:   Additional Information SSN: [id number]  Venita Lick, LCSW

## 2016-01-14 NOTE — Progress Notes (Signed)
Doing well post lap chole.  Eating well.  Wounds look good.

## 2016-01-14 NOTE — Progress Notes (Signed)
The patient is receiving Protonix by the intravenous route.  Based on criteria approved by the Pharmacy and Therapeutics Committee and the Medical Executive Committee, the medication is being converted to the equivalent oral dose form.  These criteria include: -No Active GI bleeding -Able to tolerate diet of full liquids (or better) or tube feeding OR able to tolerate other medications by the oral or enteral route  If you have any questions about this conversion, please contact the Pharmacy Department (ext [phone]).  Thank you.  Elson Clan, [facility] 01/14/2016 9:54 AM

## 2016-01-15 ENCOUNTER — Encounter (HOSPITAL_COMMUNITY): Payer: Self-pay | Admitting: General Surgery

## 2016-01-15 ENCOUNTER — Inpatient Hospital Stay (HOSPITAL_COMMUNITY): Payer: Medicare Other

## 2016-01-15 LAB — COMPREHENSIVE METABOLIC PANEL
ALBUMIN: 3 g/dL — AB (ref 3.5–5.0)
ALT: 38 U/L (ref 14–54)
AST: 46 U/L — AB (ref 15–41)
Alkaline Phosphatase: 84 U/L (ref 38–126)
Anion gap: 9 (ref 5–15)
BUN: 10 mg/dL (ref 6–20)
CHLORIDE: 98 mmol/L — AB (ref 101–111)
CO2: 30 mmol/L (ref 22–32)
CREATININE: 0.71 mg/dL (ref 0.44–1.00)
Calcium: 8.2 mg/dL — ABNORMAL LOW (ref 8.9–10.3)
GFR calc Af Amer: >60 mL/min (ref >60)
GFR calc non Af Amer: >60 mL/min (ref >60)
GLUCOSE: 113 mg/dL — AB (ref 65–99)
POTASSIUM: 3.3 mmol/L — AB (ref 3.5–5.1)
Sodium: 137 mmol/L (ref 135–145)
Total Bilirubin: 0.4 mg/dL (ref 0.3–1.2)
Total Protein: 6 g/dL — ABNORMAL LOW (ref 6.5–8.1)

## 2016-01-15 LAB — CBC
HEMATOCRIT: 38.8 % (ref 36.0–46.0)
Hemoglobin: 12.6 g/dL (ref 12.0–15.0)
MCH: 31.1 pg (ref 26.0–34.0)
MCHC: 32.5 g/dL (ref 30.0–36.0)
MCV: 95.8 fL (ref 78.0–100.0)
PLATELETS: 246 10*3/uL (ref 150–400)
RBC: 4.05 MIL/uL (ref 3.87–5.11)
RDW: 13.5 % (ref 11.5–15.5)
WBC: 3.2 10*3/uL — AB (ref 4.0–10.5)

## 2016-01-15 LAB — GLUCOSE, CAPILLARY
GLUCOSE-CAPILLARY: 92 mg/dL (ref 65–99)
Glucose-Capillary: 102 mg/dL — ABNORMAL HIGH (ref 65–99)
Glucose-Capillary: 111 mg/dL — ABNORMAL HIGH (ref 65–99)
Glucose-Capillary: 151 mg/dL — ABNORMAL HIGH (ref 65–99)

## 2016-01-15 MED ORDER — POTASSIUM CHLORIDE 20 MEQ/15ML (10%) PO SOLN
40.0000 meq | Freq: Once | ORAL | Status: AC
  Administered 2016-01-15: 40 meq via ORAL
  Filled 2016-01-15: qty 30

## 2016-01-15 NOTE — Clinical Social Work Placement (Signed)
   CLINICAL SOCIAL WORK PLACEMENT  NOTE  Date:  01/15/2016  Patient Details  Name: Amy [name] MRN: [id number] Date of Birth: 11-30-1940  Clinical Social Work is seeking post-discharge placement for this patient at the Skilled  Nursing Facility level of care (*CSW will initial, date and re-position this form in  chart as items are completed):  Yes   Patient/family provided with [place] Clinical Social Work Department's list of facilities offering this level of care within the geographic area requested by the patient (or if unable, by the patient's family).  Yes   Patient/family informed of their freedom to choose among providers that offer the needed level of care, that participate in Medicare, Medicaid or managed care program needed by the patient, have an available bed and are willing to accept the patient.  Yes   Patient/family informed of 's ownership interest in [facility] and [facility], as well as of the fact that they are under no obligation to receive care at these facilities.  PASRR submitted to EDS on 01/15/16     PASRR number received on       Existing PASRR number confirmed on       FL2 transmitted to all facilities in geographic area requested by pt/family on 01/15/16     FL2 transmitted to all facilities within larger geographic area on       Patient informed that his/her managed care company has contracts with or will negotiate with certain facilities, including the following:            Patient/family informed of bed offers received.  Patient chooses bed at       Physician recommends and patient chooses bed at      Patient to be transferred to   on  .  Patient to be transferred to facility by       Patient family notified on   of transfer.  Name of family member notified:        PHYSICIAN Please sign FL2     Additional Comment:    _______________________________________________ Orson Eva, LCSW 01/15/2016, 4:04  PM

## 2016-01-15 NOTE — Progress Notes (Signed)
[facility] PROGRESS NOTE  Amy [name] UXL:[id number] DOB: 07-22-1941 DOA: 01/09/2016 PCP: Tommy Rainwater, MD  Summary 01/10/16: Amy [name] seen and examined Amy [name] at bedside in the presence of family members including her daughter who is the power of attorney, and reviewed her chart. Appreciate general surgery. Patient has acute cholecystitis with pancreatitis. Unfortunately, she has advanced dementia therefore cannot give a meaningful history. She was admitted earlier today by Dr. Katrinka Blazing. Please refer to his comprehensive H&P for details. Patient will need cholecystectomy. Will follow general surgery recommendations. 01/11/16: Appreciate general surgery. Patient has gallstone pancreatitis with cholecystitis. Pancreatitis has resolved pretty quickly with lipase now in normal range from a level greater than 3000 at admission. Patient states she is hungry. Noted discussions regarding holding off on surgery. Family will continue to reevaluate, understanding that patient will have chronic infection if she does not undergo cholecystectomy. We'll therefore start clear liquid diet and transition her to oral antibiotics to see how she does. 01/12/16: Had fever last night after she was switched to Augmentin. This is obviously concerning. Family has decided to proceed with cholecystectomy after consulting with patient's primary care provider. Will inform general surgery-spoke with Dr. Sheliah Hatch. Appreciate help. Meanwhile, will change antibiotics back to Zosyn. Fortunately, lipase remains normal. Patient otherwise lethargic. Discussed with her daughter Tresa Endo and sister Olegario Messier. 01/13/16: Appreciate general surgery. Patient now status post cholecystectomy(01/12/16). More with it but agitated. Will monitor over the weekend and plan return to skilled nursing facility early next week, likely Monday. We'll give Haldol/Benadryl as needed for agitation. Discussed with patient's daughter Tresa Endo over the  phone. 01/14/16: Appreciate general surgery. Low grade temp last night. Confused but more engaging. Will continue oral antibiotics, and plan d/c In am. 01/15/16: Appreciate general surgery. Patient lethargic. Her daughter states that she has been coughing more than usual but chest x-ray is unrevealing. I wonder if she may be aspiration, and psychoactive medications could be worsening this phenomenon. We'll therefore hold Seroquel/Remeron, but maintain her on benzodiazepine as needed. She had a large bowel movement today, significance not clear. She is somewhat lethargic therefore will hold discharge and continue antibiotic. She has been assessed by assisted living facility and felt to be too lethargic for the facility, therefore will need short-term rehabilitation placement. Defer to clinical social work for the logistics. Appreciate help. Plan Cholecystitis/Acute gallstone pancreatitis/Abdominal pain  Augmentin  Follow general surgery recommendations Hypokalemia  Likely related to poor intake  Replenish electrolytes as necessary Hyperlipidemia/Diabetes mellitus type 2, controlled (HCC)/HTN (hypertension)/Alzheimer's disease  Discontinue Seroquel/Remeron  Continue Xanax as needed  BP in acceptable range. Continue current medications.   Code Status: DNR/DNI Family Communication: Daughter at bedside. Disposition Plan: SNF later this week   Consultants:  General surgery  Procedures:  None  Antibiotics:  Zosyn 01/10/2016>> 01/11/2016, 01/12/16>> 01/13/2016  Augmentin 01/11/1616>>01/12/16, 01/14/16>>  HPI/Subjective: Indications she is okay  Objective: Filed Vitals:   01/15/16 1009 01/15/16 1534  BP: 105/44 119/64  Pulse: 59 67  Temp: 99 F (37.2 C) 99.2 F (37.3 C)  Resp: 18 18    Intake/Output Summary (Last 24 hours) at 01/15/16 2002 Last data filed at 01/15/16 1728  Gross per 24 hour  Intake    360 ml  Output      0 ml  Net    360 ml   Filed Weights    01/10/16 1700  Weight: 68.04 kg (150 lb)    Exam:   General:  Somnolent.  Cardiovascular: S1-S2 normal. No murmurs. Pulse  regular.  Respiratory: Good air entry bilaterally. No rhonchi or rales.  Abdomen: Soft and nontender. Normal bowel sounds. No organomegaly.  Musculoskeletal: No pedal edema   Neurological: No focal deficits  Data Reviewed: Basic Metabolic Panel:  Recent Labs Lab 01/11/16 2130 01/12/16 0429 01/13/16 0517 01/14/16 0908 01/15/16 0750  NA 135 136 138 138 137  K 3.4* 3.1* 3.6 3.5 3.3*  CL 99* 100* 105 101 98*  CO2 25 25 26 28 30   GLUCOSE 107* 95 150* 135* 113*  BUN 8 8 9 12 10   CREATININE 0.56 0.68 0.53 0.62 0.71  CALCIUM 8.8* 8.5* 8.4* 8.3* 8.2*  MG  --   --   --  1.5*  --    Liver Function Tests:  Recent Labs Lab 01/11/16 2130 01/12/16 0429 01/13/16 0517 01/14/16 0908 01/15/16 0750  AST 40 32 64* 50* 46*  ALT 43 37 49 46 38  ALKPHOS 107 95 101 91 84  BILITOT 1.1 1.1 0.5 0.7 0.4  PROT 6.7 5.9* 6.0* 6.3* 6.0*  ALBUMIN 3.6 3.1* 3.1* 3.3* 3.0*    Recent Labs Lab 01/09/16 1840 01/10/16 0433 01/11/16 0100 01/12/16 0429  LIPASE >3000* 275* 44 19    Recent Labs Lab 01/11/16 2130  AMMONIA 21   CBC:  Recent Labs Lab 01/09/16 1840  01/11/16 2130 01/12/16 0429 01/13/16 0517 01/14/16 0908 01/15/16 0750  WBC 12.2*  < > 11.6* 8.8 5.2 4.2 3.2*  NEUTROABS 9.5*  --   --   --   --  3.1  --   HGB 12.1  < > 12.1 11.7* 11.3* 10.9* 12.6  HCT 37.6  < > 36.2 34.6* 33.8* 33.1* 38.8  MCV 95.7  < > 93.3 94.0 94.4 94.8 95.8  PLT 349  < > 295 257 250 228 246  < > = values in this interval not displayed. Cardiac Enzymes: No results for input(s): CKTOTAL, CKMB, CKMBINDEX, TROPONINI in the last 168 hours. BNP (last 3 results) No results for input(s): BNP in the last 8760 hours.  ProBNP (last 3 results) No results for input(s): PROBNP in the last 8760 hours.  CBG:  Recent Labs Lab 01/14/16 1656 01/14/16 2228 01/15/16 0729  01/15/16 1253 01/15/16 1621  GLUCAP 118* 103* 102* 111* 151*    Recent Results (from the past 240 hour(s))  Urine culture     Status: None   Collection Time: 01/09/16  6:34 PM  Result Value Ref Range Status   Specimen Description URINE, CLEAN CATCH  Final   Special Requests NONE  Final   Culture   Final    1,000 COLONIES/mL INSIGNIFICANT GROWTH Performed at [facility]    Report Status 01/11/2016 FINAL  Final  Culture, blood (Routine X 2) w Reflex to ID Panel     Status: None (Preliminary result)   Collection Time: 01/11/16  9:21 PM  Result Value Ref Range Status   Specimen Description BLOOD LEFT HAND  Final   Special Requests IN PEDIATRIC BOTTLE 2CC  Final   Culture   Final    NO GROWTH 3 DAYS Performed at [facility]    Report Status PENDING  Incomplete  Culture, blood (Routine X 2) w Reflex to ID Panel     Status: None (Preliminary result)   Collection Time: 01/11/16  9:30 PM  Result Value Ref Range Status   Specimen Description BLOOD RIGHT ANTECUBITAL  Final   Special Requests BOTTLES DRAWN AEROBIC AND ANAEROBIC 5CC  Final   Culture  Final    NO GROWTH 3 DAYS Performed at [facility]    Report Status PENDING  Incomplete  Surgical pcr screen     Status: None   Collection Time: 01/12/16  2:01 PM  Result Value Ref Range Status   MRSA, PCR NEGATIVE NEGATIVE Final   Staphylococcus aureus NEGATIVE NEGATIVE Final    Comment:        The Xpert SA Assay (FDA approved for NASAL specimens in patients over 24 years of age), is one component of a comprehensive surveillance program.  Test performance has been validated by [facility] for patients greater than or equal to 35 year old. It is not intended to diagnose infection nor to guide or monitor treatment.      Studies: Dg Chest Port 1 View  01/15/2016  CLINICAL DATA:  Productive cough for 2 days EXAM: PORTABLE CHEST 1 VIEW COMPARISON:  07/29/2013 FINDINGS: The heart size and mediastinal  contours are within normal limits. Both lungs are clear. The visualized skeletal structures are unremarkable. IMPRESSION: No active disease. Electronically Signed   By: Alcide Clever M.D.   On: 01/15/2016 16:48    Scheduled Meds: . ALPRAZolam  0.25 mg Oral QHS  . amoxicillin-clavulanate  1 tablet Oral Q12H  . aspirin  81 mg Oral Daily  . cholecalciferol  1,000 Units Oral Daily  . enalapril  20 mg Oral q morning - 10a  . enoxaparin (LOVENOX) injection  40 mg Subcutaneous QHS  . insulin aspart  0-9 Units Subcutaneous TID WC  . memantine  21 mg Oral Daily  . pantoprazole  40 mg Oral Daily  . potassium chloride  40 mEq Oral Once  . saccharomyces boulardii  250 mg Oral Daily   Continuous Infusions:    Time spent: 25 minutes    Othmar Ringer  [facility] Pager [phone]. If 7PM-7AM, please contact night-coverage at [email or web address], password [facility] 01/15/2016, 8:02 PM  LOS: 6 days

## 2016-01-15 NOTE — Progress Notes (Signed)
CSW continuing to follow.   Pt admitted from [facility] memory care ALF. CSW received phone call from Blanco at [facility] memory care ALF who stated that she came to assess pt this morning and at this time [facility] will not be able to accept pt back upon discharge and pt will need to go to SNF rehab.  Pt has [employer] Medicare. No PT eval completed yet. CSW notified MD of need for PT eval in order to arrange SNF placement. PT ordered  CSW spoke with pt daughter, Tresa Endo via telephone. Pt daughter had been notified from [facility] memory care ALF that they could not accept pt back at this time. Pt daughter agreeable to [facility] search. CSW discussed process and clarified questions.   CSW completed FL2 and initiated SNF search to [facility]. CSW submitted pasarr and pasarr under manual review due to pt advanced dementia.   CSW to follow up with pt daughter re: SNF bed offers and decision for SNF.   CSW to continue to follow and assist with pt discharge needs when medically ready and pasarr received.  Loletta Specter, MSW, LCSW Clinical Social Work Coverage for [employer], [place]  [phone]

## 2016-01-15 NOTE — Progress Notes (Signed)
Patient ID: Amy [name], female   DOB: 28-Aug-1941, 75 y.o.   MRN: [id number] 3 Days Post-Op  Subjective: She denies pain. Alert with baseline confusion.  Objective: Vital signs in last 24 hours: Temp:  [97.8 F (36.6 C)-98.2 F (36.8 C)] 97.8 F (36.6 C) (02/20 0600) Pulse Rate:  [61-78] 61 (02/20 0600) Resp:  [16-18] 18 (02/20 0600) BP: (126-135)/(51-54) 135/53 mmHg (02/20 0600) SpO2:  [92 %-100 %] 100 % (02/20 0600) Last BM Date: 01/10/16  Intake/Output from previous day: 02/19 0701 - 02/20 0700 In: 240 [P.O.:240] Out: 0  Intake/Output this shift:    GI: normal findings: soft, non-tender Incision/Wound:clean and dry  Lab Results:   Recent Labs  01/14/16 0908 01/15/16 0750  WBC 4.2 3.2*  HGB 10.9* 12.6  HCT 33.1* 38.8  PLT 228 246   BMET  Recent Labs  01/13/16 0517 01/14/16 0908  NA 138 138  K 3.6 3.5  CL 105 101  CO2 26 28  GLUCOSE 150* 135*  BUN 9 12  CREATININE 0.53 0.62  CALCIUM 8.4* 8.3*     Studies/Results: No results found.  Anti-infectives: Anti-infectives    Start     Dose/Rate Route Frequency Ordered Stop   01/14/16 2000  amoxicillin-clavulanate (AUGMENTIN) 875-125 MG per tablet 1 tablet     1 tablet Oral Every 12 hours 01/14/16 1624     01/14/16 1400  piperacillin-tazobactam (ZOSYN) IVPB 3.375 g  Status:  Discontinued     3.375 g 12.5 mL/hr over 240 Minutes Intravenous 3 times per day 01/14/16 1337 01/14/16 1624   01/12/16 2300  piperacillin-tazobactam (ZOSYN) IVPB 3.375 g     3.375 g 12.5 mL/hr over 240 Minutes Intravenous 3 times per day 01/12/16 1813 01/13/16 0922   01/12/16 1400  piperacillin-tazobactam (ZOSYN) IVPB 3.375 g  Status:  Discontinued     3.375 g 100 mL/hr over 30 Minutes Intravenous 3 times per day 01/12/16 1212 01/12/16 1220   01/12/16 1400  piperacillin-tazobactam (ZOSYN) IVPB 3.375 g  Status:  Discontinued     3.375 g 12.5 mL/hr over 240 Minutes Intravenous 3 times per day 01/12/16 1220 01/12/16 1813   01/11/16 2200  amoxicillin-clavulanate (AUGMENTIN) 875-125 MG per tablet 1 tablet  Status:  Discontinued     1 tablet Oral Every 12 hours 01/11/16 1519 01/12/16 1212   01/10/16 1400  piperacillin-tazobactam (ZOSYN) IVPB 3.375 g  Status:  Discontinued     3.375 g 12.5 mL/hr over 240 Minutes Intravenous 3 times per day 01/10/16 1036 01/10/16 1038   01/10/16 1400  piperacillin-tazobactam (ZOSYN) IVPB 3.375 g  Status:  Discontinued     3.375 g 12.5 mL/hr over 240 Minutes Intravenous 3 times per day 01/10/16 1134 01/11/16 1519      Assessment/Plan: s/p Procedure(s): LAPAROSCOPIC CHOLECYSTECTOMY WITH INTRAOPERATIVE CHOLANGIOGRAM Alzheimer's Awaiting placement without apparent postoperative complications. Please call us if needed.   LOS: 6 days    Rohnan Bartleson T 01/15/2016

## 2016-01-15 NOTE — Progress Notes (Addendum)
[facility] PROGRESS NOTE  DENEEN SLAGER QMV:[id number] DOB: June 24, 1941 DOA: 01/09/2016 PCP: Tommy Rainwater, MD  Summary 01/10/16: Peggye Form seen and examined Ms. Barco at bedside in the presence of family members including her daughter who is the power of attorney, and reviewed her chart. Appreciate general surgery. Patient has acute cholecystitis with pancreatitis. Unfortunately, she has advanced dementia therefore cannot give a meaningful history. She was admitted earlier today by Dr. Katrinka Blazing. Please refer to his comprehensive H&P for details. Patient will need cholecystectomy. Will follow general surgery recommendations. 01/11/16: Appreciate general surgery. Patient has gallstone pancreatitis with cholecystitis. Pancreatitis has resolved pretty quickly with lipase now in normal range from a level greater than 3000 at admission. Patient states she is hungry. Noted discussions regarding holding off on surgery. Family will continue to reevaluate, understanding that patient will have chronic infection if she does not undergo cholecystectomy. We'll therefore start clear liquid diet and transition her to oral antibiotics to see how she does. 01/12/16: Had fever last night after she was switched to Augmentin. This is obviously concerning. Family has decided to proceed with cholecystectomy after consulting with patient's primary care provider. Will inform general surgery-spoke with Dr. Sheliah Hatch. Appreciate help. Meanwhile, will change antibiotics back to Zosyn. Fortunately, lipase remains normal. Patient otherwise lethargic. Discussed with her daughter Tresa Endo and sister Olegario Messier. 01/13/16: Appreciate general surgery. Patient now status post cholecystectomy(01/12/16). More with it but agitated. Will monitor over the weekend and plan return to skilled nursing facility early next week, likely Monday. We'll give Haldol/Benadryl as needed for agitation. Discussed with patient's daughter Tresa Endo over the  phone. 01/14/16: Appreciate general surgery. Low grade temp last night. Confused but more engaging. Will continue oral antibiotics, and plan d/c  In am. Plan Cholecystitis/Acute gallstone pancreatitis/Abdominal pain  Augmentin  Follow general surgery recommendations Hypokalemia  Likely related to poor intake  Replenish electrolytes as necessary Hyperlipidemia/Diabetes mellitus type 2, controlled (HCC)/HTN (hypertension)/Alzheimer's disease  Generally controlled  Continue current medications, including most of all medications   Code Status: DNR/DNI Family Communication: Daughter at bedside, sister over the phone Disposition Plan: ?Back to SNF post op next week   Consultants:  General surgery  Procedures:  None  Antibiotics:  Zosyn 01/10/2016>> 01/11/2016, 01/12/16>> 01/13/2016  Augmentin 01/11/1616>>01/12/16, 01/14/16>>  HPI/Subjective: Denies any complaints.  Objective: Filed Vitals:   01/14/16 1344 01/14/16 2229  BP: 126/54 135/51  Pulse: 78 74  Temp: 98.2 F (36.8 C) 98.2 F (36.8 C)  Resp: 18 16    Intake/Output Summary (Last 24 hours) at 01/15/16 0045 Last data filed at 01/14/16 1752  Gross per 24 hour  Intake    240 ml  Output    300 ml  Net    -60 ml   Filed Weights   01/10/16 1700  Weight: 68.04 kg (150 lb)    Exam:   General:  Comfortable at rest.  Cardiovascular: S1-S2 normal. No murmurs. Pulse regular.  Respiratory: Good air entry bilaterally. No rhonchi or rales.  Abdomen: Soft and nontender. Normal bowel sounds. No organomegaly.  Musculoskeletal: No pedal edema   Neurological: Intact  Data Reviewed: Basic Metabolic Panel:  Recent Labs Lab 01/11/16 0100 01/11/16 2130 01/12/16 0429 01/13/16 0517 01/14/16 0908  NA 138 135 136 138 138  K 3.3* 3.4* 3.1* 3.6 3.5  CL 103 99* 100* 105 101  CO2 21* GLUCOSE 143* 107* 95 150* 135*  BUN CREATININE 0.64 0.56 0.68 0.53 0.62  CALCIUM 8.7* 8.8* 8.5* 8.4*  8.3*  MG  --   --   --   --  1.5*   Liver Function Tests:  Recent Labs Lab 01/11/16 0100 01/11/16 2130 01/12/16 0429 01/13/16 0517 01/14/16 0908  AST 44* 40 32 64* 50*  ALT 48 43 37 49 46  ALKPHOS 95 107 95 101 91  BILITOT 1.2 1.1 1.1 0.5 0.7  PROT 6.3* 6.7 5.9* 6.0* 6.3*  ALBUMIN 3.5 3.6 3.1* 3.1* 3.3*    Recent Labs Lab 01/09/16 1840 01/10/16 0433 01/11/16 0100 01/12/16 0429  LIPASE >3000* 275* 44 19    Recent Labs Lab 01/11/16 2130  AMMONIA 21   CBC:  Recent Labs Lab 01/09/16 1840  01/11/16 0100 01/11/16 2130 01/12/16 0429 01/13/16 0517 01/14/16 0908  WBC 12.2*  < > 10.5 11.6* 8.8 5.2 4.2  NEUTROABS 9.5*  --   --   --   --   --  3.1  HGB 12.1  < > 12.8 12.1 11.7* 11.3* 10.9*  HCT 37.6  < > 38.9 36.2 34.6* 33.8* 33.1*  MCV 95.7  < > 93.7 93.3 94.0 94.4 94.8  PLT 349  < > 322 295 257 250 228  < > = values in this interval not displayed. Cardiac Enzymes: No results for input(s): CKTOTAL, CKMB, CKMBINDEX, TROPONINI in the last 168 hours. BNP (last 3 results) No results for input(s): BNP in the last 8760 hours.  ProBNP (last 3 results) No results for input(s): PROBNP in the last 8760 hours.  CBG:  Recent Labs Lab 01/13/16 1930 01/14/16 0818 01/14/16 1214 01/14/16 1656 01/14/16 2228  GLUCAP 134* 116* 128* 118* 103*    Recent Results (from the past 240 hour(s))  Urine culture     Status: None   Collection Time: 01/09/16  6:34 PM  Result Value Ref Range Status   Specimen Description URINE, CLEAN CATCH  Final   Special Requests NONE  Final   Culture   Final    1,000 COLONIES/mL INSIGNIFICANT GROWTH Performed at [facility]    Report Status 01/11/2016 FINAL  Final  Culture, blood (Routine X 2) w Reflex to ID Panel     Status: None (Preliminary result)   Collection Time: 01/11/16  9:21 PM  Result Value Ref Range Status   Specimen Description BLOOD LEFT HAND  Final   Special Requests IN PEDIATRIC BOTTLE 2CC  Final   Culture   Final     NO GROWTH 2 DAYS Performed at [facility]    Report Status PENDING  Incomplete  Culture, blood (Routine X 2) w Reflex to ID Panel     Status: None (Preliminary result)   Collection Time: 01/11/16  9:30 PM  Result Value Ref Range Status   Specimen Description BLOOD RIGHT ANTECUBITAL  Final   Special Requests BOTTLES DRAWN AEROBIC AND ANAEROBIC 5CC  Final   Culture   Final    NO GROWTH 2 DAYS Performed at [facility]    Report Status PENDING  Incomplete  Surgical pcr screen     Status: None   Collection Time: 01/12/16  2:01 PM  Result Value Ref Range Status   MRSA, PCR NEGATIVE NEGATIVE Final   Staphylococcus aureus NEGATIVE NEGATIVE Final    Comment:        The Xpert SA Assay (FDA approved for NASAL specimens in patients over 21 years of age), is one component of a comprehensive surveillance program.  Test performance has been validated by  [place] for patients greater than or equal to 93 year old. It is not intended to diagnose infection nor to guide or monitor treatment.      Studies: No results found.  Scheduled Meds: . ALPRAZolam  0.25 mg Oral QHS  . amoxicillin-clavulanate  1 tablet Oral Q12H  . aspirin  81 mg Oral Daily  . cholecalciferol  1,000 Units Oral Daily  . enalapril  20 mg Oral q morning - 10a  . enoxaparin (LOVENOX) injection  40 mg Subcutaneous QHS  . insulin aspart  0-9 Units Subcutaneous TID WC  . loratadine  10 mg Oral Daily  . memantine  21 mg Oral Daily  . mirtazapine  22.5 mg Oral QHS  . pantoprazole  40 mg Oral Daily  . potassium chloride  40 mEq Oral Once  . QUEtiapine  50 mg Oral QHS  . rivastigmine  4.6 mg Transdermal Daily  . saccharomyces boulardii  250 mg Oral Daily   Continuous Infusions:    Time spent: 25 minutes    Cheyane Ayon  [facility] Pager [phone]. If 7PM-7AM, please contact night-coverage at [email or web address], password [facility] 01/15/2016, 12:45 AM  LOS: 6 days

## 2016-01-15 NOTE — NC FL2 (Signed)
[place] MEDICAID FL2 LEVEL OF CARE SCREENING TOOL     IDENTIFICATION  Patient Name: Amy [name] Birthdate: July 20, 1941 Sex: female Admission Date (Current Location): 01/09/2016  [facility] and [place] Number:  Producer, television/film/video and Address:  [facility],  [address], [place] [address]      Provider Number: [id number]  Attending Physician Name and Address:  Amy Canal, MD  Relative Name and Phone Number:       Current Level of Care: Hospital Recommended Level of Care: Skilled Nursing Facility Prior Approval Number:    Date Approved/Denied:   PASRR Number:    Discharge Plan: SNF    Current Diagnoses: Patient Active Problem List   Diagnosis Date Noted  . Acute gallstone pancreatitis 01/10/2016  . Abdominal pain 01/10/2016  . Cholecystitis 01/09/2016  . Alzheimer's disease 02/22/2015  . Encounter for long-term (current) use of other medications 03/16/2014  . SDAT 03/16/2014  . Vitamin D Deficiency 03/16/2014  . Diabetes mellitus type 2, controlled (HCC)   . HTN (hypertension)   . Hyperlipidemia 09/29/2009    Orientation RESPIRATION BLADDER Height & Weight     Self  Normal Continent Weight: 150 lb (68.04 kg) Height:   (167.6 cm)  BEHAVIORAL SYMPTOMS/MOOD NEUROLOGICAL BOWEL NUTRITION STATUS  Other (Comment) (can be uncooperative with care at time secondary to advanced dementia, admitted from [facility] memory care ALF, per RN, no behaviors today)  (NONE) Continent Diet (Regular)  AMBULATORY STATUS COMMUNICATION OF NEEDS Skin   Extensive Assist (+1 to 2 assist for transfers and ambulations per RN, awaiting PT eval) Verbally Surgical wounds (Abdomincal incision, 4 ports - 1. Umbilicus, 2. Mid;upper, 3. Right;Lateral;Upper, 4. Right;lateral;superior.)                       Personal Care Assistance Level of Assistance  Bathing, Feeding, Dressing Bathing Assistance: Limited assistance Feeding assistance: Independent Dressing  Assistance: Limited assistance     Functional Limitations Info  Hearing, Sight, Speech Sight Info: Adequate Hearing Info: Impaired Speech Info: Adequate    SPECIAL CARE FACTORS FREQUENCY  PT (By licensed PT), OT (By licensed OT)     PT Frequency: 5 x [name] week OT Frequency: 5 x [name] week            Contractures Contractures Info: Not present    Additional Factors Info  Code Status, Allergies, Psychotropic, Insulin Sliding Scale Code Status Info: FULL code status Allergies Info: Latex, Lipitor, Shellfish-derived Products Psychotropic Info: Exelon, Seroquel, Remeron, Namenda, Xanax Insulin Sliding Scale Info: 3/day       Current Medications (01/15/2016):  This is the current hospital active medication list Current Facility-Administered Medications  Medication Dose Route Frequency Provider Last Rate Last Dose  . acetaminophen (TYLENOL) tablet 650 mg  650 mg Oral Q4H PRN Amy Braun, MD   650 mg at 01/11/16 2105  . albuterol (PROVENTIL) (2.5 MG/3ML) 0.083% nebulizer solution 2.5 mg  2.5 mg Nebulization Q2H PRN Amy Braun, MD      . ALPRAZolam Prudy Feeler) tablet 0.25 mg  0.25 mg Oral Q4H PRN Amy Braun, MD   0.25 mg at 01/15/16 0830  . ALPRAZolam Prudy Feeler) tablet 0.25 mg  0.25 mg Oral QHS Amy Apley [place], PA-C   0.25 mg at 01/14/16 2229  . amoxicillin-clavulanate (AUGMENTIN) 875-125 MG per tablet 1 tablet  1 tablet Oral Q12H Amy Ranga, MD   1 tablet at 01/15/16 0830  . aspirin chewable tablet 81 mg  81 mg Oral Daily Amy Braun, MD   81 mg at 01/15/16 1123  . bisacodyl (DULCOLAX) suppository 10 mg  10 mg Rectal Daily PRN Amy Ranga, MD      . cholecalciferol (VITAMIN D) tablet 1,000 Units  1,000 Units Oral Daily Amy Braun, MD   1,000 Units at 01/15/16 1124  . enalapril (VASOTEC) tablet 20 mg  20 mg Oral q morning - 10a Amy Braun, MD   20 mg at 01/15/16 1124  . enoxaparin (LOVENOX) injection 40 mg  40 mg Subcutaneous QHS Amy Braun, MD   40 mg at  01/12/16 2029  . insulin aspart (novoLOG) injection 0-9 Units  0-9 Units Subcutaneous TID WC Amy Braun, MD   1 Units at 01/14/16 1238  . loratadine (CLARITIN) tablet 10 mg  10 mg Oral Daily Amy Braun, MD   10 mg at 01/15/16 1123  . memantine (NAMENDA XR) 24 hr capsule 21 mg  21 mg Oral Daily Amy Braun, MD   21 mg at 01/15/16 1123  . mirtazapine (REMERON) tablet 22.5 mg  22.5 mg Oral QHS Amy Braun, MD   22.5 mg at 01/14/16 2229  . morphine 2 MG/ML injection 1 mg  1 mg Intravenous Q2H PRN Amy Braun, MD   1 mg at 01/13/16 0925  . ondansetron (ZOFRAN) tablet 4 mg  4 mg Oral Q6H PRN Amy Braun, MD       Or  . ondansetron (ZOFRAN) injection 4 mg  4 mg Intravenous Q6H PRN Amy Braun, MD      . oxyCODONE (Oxy IR/ROXICODONE) immediate release tablet 5 mg  5 mg Oral Q4H PRN Amy Ranga, MD   5 mg at 01/14/16 0406  . pantoprazole (PROTONIX) EC tablet 40 mg  40 mg Oral Daily Amy Ranga, MD   40 mg at 01/15/16 1123  . potassium chloride 20 MEQ/15ML (10%) solution 40 mEq  40 mEq Oral Once Amy Ranga, MD   40 mEq at 01/14/16 1440  . QUEtiapine (SEROQUEL) tablet 50 mg  50 mg Oral QHS Amy Braun, MD   50 mg at 01/14/16 2229  . rivastigmine (EXELON) 4.6 mg/24hr 4.6 mg  4.6 mg Transdermal Daily Amy [name] Katrinka Blazing, MD   4.6 mg at 01/15/16 1122  . saccharomyces boulardii (FLORASTOR) capsule 250 mg  250 mg Oral Daily Amy Ranga, MD   250 mg at 01/15/16 1123     Discharge Medications: Please see discharge summary for [name] list of discharge medications.  Relevant Imaging Results:  Relevant Lab Results:   Additional Information SSN: [id number]  Amy Encarnacion [name], Amy [name]

## 2016-01-16 LAB — GLUCOSE, CAPILLARY
GLUCOSE-CAPILLARY: 147 mg/dL — AB (ref 65–99)
Glucose-Capillary: 119 mg/dL — ABNORMAL HIGH (ref 65–99)
Glucose-Capillary: 162 mg/dL — ABNORMAL HIGH (ref 65–99)
Glucose-Capillary: 136 mg/dL — ABNORMAL HIGH (ref 65–99)

## 2016-01-16 LAB — BASIC METABOLIC PANEL
Anion gap: 9 (ref 5–15)
BUN: 13 mg/dL (ref 6–20)
CALCIUM: 9 mg/dL (ref 8.9–10.3)
CO2: 29 mmol/L (ref 22–32)
Chloride: 101 mmol/L (ref 101–111)
Creatinine, Ser: 0.67 mg/dL (ref 0.44–1.00)
GFR calc Af Amer: >60 mL/min (ref >60)
GLUCOSE: 110 mg/dL — AB (ref 65–99)
Potassium: 3.5 mmol/L (ref 3.5–5.1)
Sodium: 139 mmol/L (ref 135–145)

## 2016-01-16 LAB — CBC
HCT: 38 % (ref 36.0–46.0)
HEMOGLOBIN: 12.8 g/dL (ref 12.0–15.0)
MCH: 31.1 pg (ref 26.0–34.0)
MCHC: 33.7 g/dL (ref 30.0–36.0)
MCV: 92.5 fL (ref 78.0–100.0)
Platelets: 226 10*3/uL (ref 150–400)
RBC: 4.11 MIL/uL (ref 3.87–5.11)
RDW: 12.8 % (ref 11.5–15.5)
WBC: 4.4 10*3/uL (ref 4.0–10.5)

## 2016-01-16 MED ORDER — PANTOPRAZOLE SODIUM 40 MG PO TBEC: 40.0000 mg | DELAYED_RELEASE_TABLET | Freq: Every day | ORAL | Status: DC

## 2016-01-16 MED ORDER — AMOXICILLIN-POT CLAVULANATE 875-125 MG PO TABS: 1.0000 | ORAL_TABLET | Freq: Two times a day (BID) | ORAL | Status: DC

## 2016-01-16 MED ORDER — ALPRAZOLAM 0.25 MG PO TABS: 0.2500 mg | ORAL_TABLET | ORAL | Status: DC | PRN

## 2016-01-16 NOTE — Evaluation (Signed)
Physical Therapy Evaluation Patient Details Name: Amy [name] MRN: [id number] DOB: [birth year] Today's Date: 01/16/2016   History of Present Illness  Pt is a 75 year old female with hx of Alzheimers dementia and DM s/p LAPAROSCOPIC CHOLECYSTECTOMY WITH INTRAOPERATIVE CHOLANGIOGRAM  Clinical Impression  Pt admitted with above diagnosis. Pt currently with functional limitations due to the deficits listed below (see PT Problem List).  Pt will benefit from skilled PT to increase their independence and safety with mobility to allow discharge to the venue listed below.  Pt pleasantly confused during session and ambulated short distance, assisted using BSC, and then returned to bed.       Follow Up Recommendations SNF;Supervision/Assistance - 24 hour    Equipment Recommendations  None recommended by PT    Recommendations for Other Services       Precautions / Restrictions Precautions Precautions: Fall      Mobility  Bed Mobility Overal bed mobility: Needs Assistance Bed Mobility: Supine to Sit;Sit to Supine     Supine to sit: Mod assist Sit to supine: Mod assist   General bed mobility comments: assist for trunk upright and LEs onto bed  Transfers Overall transfer level: Needs assistance Equipment used: 1 person hand held assist Transfers: Sit to/from Stand;Stand Pivot Transfers Sit to Stand: Min assist Stand pivot transfers: Min assist       General transfer comment: assist to rise, steady and control descent  Ambulation/Gait Ambulation/Gait assistance: Min assist Ambulation Distance (Feet): 40 Feet Assistive device: 1 person hand held assist Gait Pattern/deviations: Step-through pattern;Decreased stride length;Wide base of support     General Gait Details: multimodal cues for ambulation likely due to dementia, pt fatigued quickly, assist to steady  Stairs            Wheelchair Mobility    Modified Rankin (Stroke Patients Only)       Balance                                              Pertinent Vitals/Pain Pain Assessment: Faces Faces Pain Scale: Hurts little more Pain Location: abdomen Pain Descriptors / Indicators: Grimacing Pain Intervention(s): Limited activity within patient's tolerance;Monitored during session    Home Living Family/patient expects to be discharged to:: Assisted living                 Additional Comments: from ALF per chart    Prior Function           Comments: pt poor historian, from memory care unit     Hand Dominance        Extremity/Trunk Assessment   Upper Extremity Assessment: Generalized weakness           Lower Extremity Assessment: Generalized weakness         Communication   Communication: No difficulties  Cognition Arousal/Alertness: Awake/alert Behavior During Therapy: Restless Overall Cognitive Status: No family/caregiver present to determine baseline cognitive functioning (pleasantly confused today, hx of dementia)                      General Comments      Exercises        Assessment/Plan    PT Assessment Patient needs continued PT services  PT Diagnosis Difficulty walking   PT Problem List Decreased strength;Decreased activity tolerance;Decreased mobility;Decreased balance;Decreased knowledge of use of DME;Decreased safety awareness  PT Treatment Interventions DME instruction;Gait training;Patient/family education;Functional mobility training;Therapeutic activities;Therapeutic exercise   PT Goals (Current goals can be found in the Care Plan section) Acute Rehab PT Goals PT Goal Formulation: Patient unable to participate in goal setting Time For Goal Achievement: 01/23/16 Potential to Achieve Goals: Good    Frequency Min 2X/week   Barriers to discharge        Co-evaluation               End of Session Equipment Utilized During Treatment: Gait belt Activity Tolerance: Patient limited by fatigue Patient  left: in bed;with call bell/phone within reach;with bed alarm set Nurse Communication: Mobility status         Time: 1610-9604 PT Time Calculation (min) (ACUTE ONLY): 17 min   Charges:   PT Evaluation $PT Eval Low Complexity: 1 Procedure     PT G Codes:        Lorynn Moeser,KATHrine E 01/16/2016, 9:43 AM Zenovia Jarred, PT, DPT 01/16/2016 Pager: [phone]

## 2016-01-16 NOTE — Care Management Important Message (Signed)
Important Message  Patient Details  Name: Amy [name] MRN: [id number] Date of Birth: [birth year]   Medicare Important Message Given:  Yes    Haskell Flirt 01/16/2016, 1:24 PMImportant Message  Patient Details  Name: Amy [name] MRN: [id number] Date of Birth: 03/04/1941   Medicare Important Message Given:  Yes    Haskell Flirt 01/16/2016, 1:24 PM

## 2016-01-16 NOTE — Discharge Summary (Signed)
Amy [name], is a 75 y.o. female  DOB 1941-01-12  MRN [id number].  Admission date:  01/09/2016  Admitting Physician  Clydie Braun, MD  Discharge Date:  01/16/2016   Primary MD  Shamleffer, Landry Mellow, MD  Recommendations for primary care physician for things to follow:  Please streamline psychoactive medications as possible.  Admission Diagnosis   Acute cholecystitis [K81.0] Gallstone pancreatitis [K85.10] Alzheimer's dementia [G30.9, F02.80]   Discharge Diagnosis  Acute cholecystitis [K81.0] Gallstone pancreatitis [K85.10] Alzheimer's dementia [G30.9, F02.80]   Principal Problem:   Cholecystitis Active Problems:   Hyperlipidemia   Diabetes mellitus type 2, controlled (HCC)   HTN (hypertension)   Alzheimer's disease   Acute gallstone pancreatitis   Abdominal pain      Summary Amy [name] is a pleasant 74 year old female with Alzheimer's dementia/type 2 diabetes mellitus/hyperlipidemia/history of gallstones will came in with abdominal pain associated with worsening confusion and fever, and she was found to have acute gallstone pancreatitis with acute cholecystitis. She had laparoscopic cholecystectomy performed by Dr. Sheliah Hatch and has done generally okay postop. However, she has had weakness on and off, probably part of dementia and not much activity postop. She will be discharged to skilled nursing facility for short-term rehabilitation. Ms. Ariola should complete 3 more days of Augmentin and follow with general surgery as planned. Otherwise, some of her psychoactive medications including Seroquel/Remeron have been held for now as patient seemed to be more sedated on them. I have deferred further adjustment of this medications to providers at the skilled nursing facility. However she'll be on Xanax  as needed. She is discharged in relatively stable condition. I discussed the discharge plan with patient's daughter Amy [name] over the phone. Please refer to the daily progress notes below for details of patient's hospital stay;  Hospital Course  01/10/16: I've seen and examined [name] at bedside in the presence of family members including her daughter who is the power of attorney, and reviewed her chart. Appreciate general surgery. Patient has acute cholecystitis with pancreatitis. Unfortunately, she has advanced dementia therefore cannot give a meaningful history. She was admitted earlier today by Dr. Katrinka Blazing. Please refer to his comprehensive H&P for details. Patient will need cholecystectomy. Will follow general surgery recommendations. 01/11/16: Appreciate general surgery. Patient has gallstone pancreatitis with cholecystitis. Pancreatitis has resolved pretty quickly with lipase now in normal range from a level greater than 3000 at admission. Patient states she is hungry. Noted discussions regarding holding off on surgery. Family will continue to reevaluate, understanding that patient will have chronic infection if she does not undergo cholecystectomy. We'll therefore start clear liquid diet and transition her to oral antibiotics to see how she does. 01/12/16: Had fever last night after she was switched to Augmentin. This is obviously concerning. Family has decided to proceed with cholecystectomy after consulting with patient's primary care provider. Will inform general surgery-spoke with Dr. Sheliah Hatch. Appreciate help. Meanwhile, will change antibiotics back to Zosyn. Fortunately, lipase remains normal. Patient otherwise lethargic. Discussed with her daughter Amy [name] and sister  Amy [name]. 01/13/16: Appreciate general surgery. Patient now status post cholecystectomy(01/12/16). More with it but agitated. Will monitor over the weekend and plan return to skilled nursing facility early next week, likely Monday. We'll give  Haldol/Benadryl as needed for agitation. Discussed with patient's daughter Amy [name] over the phone. 01/14/16: Appreciate general surgery. Low grade temp last night. Confused but more engaging. Will continue oral antibiotics, and plan d/c In am. 01/15/16: Appreciate general surgery. Patient lethargic. Her daughter states that she has been coughing more than usual but chest x-ray is unrevealing. I wonder if she may be aspiration, and psychoactive medications could be worsening this phenomenon. We'll therefore hold Seroquel/Remeron, but maintain her on benzodiazepine as needed. She had a large bowel movement today, significance not clear. She is somewhat lethargic therefore will hold discharge and continue antibiotic. She has been assessed by assisted living facility and felt to be too lethargic for the facility, therefore will need short-term rehabilitation placement. Defer to clinical social work for the logistics. Appreciate help. 01/16/16: Patient more with it today after her psychoactive medications(Seroquel/Remeron) were held last night. She'll probably benefit from less psychoactive medications going forward as possible. I will keep her on Xanax as needed for now and defer further adjustment of dementia medications to primary care providers at the skilled nursing facility. She is stable for discharge to skilled nursing facility to continue 3 more days of Augmentin. She should follow with general surgery as scheduled. I have discussd the discharge plan with patient's daughter Amy [name] over the phone.  Discharge Condition Stable for her condition.  Consults obtained  General surgery   Follow UP Follow-up Information    Follow up with [facility] On 02/07/2016.   Specialty:  General Surgery   Why:  arrive by 8AM for a 8:30AM post op check   Contact information:   [address] [place] [address] [phone]         Discharge Instructions  and  Discharge Medications    Discharge Instructions    Diet Carb Modified    Complete by:  As directed      Discharge instructions    Complete by:  As directed   Follow general surgery recommendations     Increase activity slowly    Complete by:  As directed             Medication List    STOP taking these medications        levofloxacin 500 MG tablet  Commonly known as:  LEVAQUIN     mirtazapine 30 MG tablet  Commonly known as:  REMERON     QUEtiapine 50 MG tablet  Commonly known as:  SEROQUEL     rivastigmine 4.6 mg/24hr  Commonly known as:  EXELON      TAKE these medications        acetaminophen 325 MG tablet  Commonly known as:  TYLENOL  Take 650 mg by mouth every 4 (four) hours as needed for moderate pain.     ALPRAZolam 0.25 MG tablet  Commonly known as:  XANAX  Take 1 tablet (0.25 mg total) by mouth every 4 (four) hours as needed for anxiety.     amoxicillin-clavulanate 875-125 MG tablet  Commonly known as:  AUGMENTIN  Take 1 tablet by mouth every 12 (twelve) hours.     aspirin 81 MG tablet  Take 81 mg by mouth daily.     BAYER CONTOUR TEST test strip  Generic drug:  glucose blood  USE TO CHECK GLUCOSE EVERY DAY     busPIRone 10 MG tablet  Commonly known as:  BUSPAR  Take 10 mg by mouth 3 (three) times daily.     cetirizine 10 MG tablet  Commonly known as:  ZYRTEC  Take 10 mg by mouth daily as needed for allergies.     cholecalciferol 1000 units tablet  Commonly known as:  VITAMIN D  Take 1,000 Units by mouth daily.     enalapril 20 MG tablet  Commonly known as:  VASOTEC  TAKE 1 TABLET BY MOUTH EVERY MORNING     fenofibrate micronized 134 MG capsule  Commonly known as:  LOFIBRA  TAKE ONE CAPSULE BY MOUTH DAILY     Melatonin 5 MG Tabs  Take 10 mg by mouth at bedtime.     metFORMIN 500 MG tablet  Commonly known as:  GLUCOPHAGE  Take 500 mg by mouth daily.     NAMENDA XR 21 MG Cp24  Generic drug:  Memantine HCl ER  Take 1 tablet by mouth daily.      pantoprazole 40 MG tablet  Commonly known as:  PROTONIX  Take 1 tablet (40 mg total) by mouth daily.     saccharomyces boulardii 250 MG capsule  Commonly known as:  FLORASTOR  Take 250 mg by mouth daily. 14 Day Course.     sertraline 25 MG tablet  Commonly known as:  ZOLOFT  Take 25 mg by mouth daily.        Diet and Activity recommendation: See Discharge Instructions above  Major procedures and Radiology Reports - PLEASE review detailed and final reports for all details, in brief -    Dg Cholangiogram Operative  01/12/2016  CLINICAL DATA:  Calculus cholecystitis EXAM: INTRAOPERATIVE CHOLANGIOGRAM TECHNIQUE: Cholangiographic images from the C-arm fluoroscopic device were submitted for interpretation post-operatively. Please see the procedural report for the amount of contrast and the fluoroscopy time utilized. COMPARISON:  01/09/2016 FINDINGS: Intraoperative cholangiogram performed during the laparoscopic cholecystectomy. The cystic duct, biliary confluence, common hepatic duct, and common bile duct are patent. Contrast drains into the duodenum. No dilatation or obstruction. IMPRESSION: Patent biliary system. Electronically Signed   By: Judie Petit.  Shick M.D.   On: 01/12/2016 17:03   Ct Abdomen Pelvis W Contrast  01/09/2016  CLINICAL DATA:  Abdominal pain EXAM: CT ABDOMEN AND PELVIS WITH CONTRAST TECHNIQUE: Multidetector CT imaging of the abdomen and pelvis was performed using the standard protocol following bolus administration of intravenous contrast. CONTRAST:  OMNIPAQUE IOHEXOL 300 MG/ML SOLN, 25mL OMNIPAQUE IOHEXOL 300 MG/ML SOLN COMPARISON:  None. FINDINGS: The gallbladder is distended. There is wall thickening and wall edema. No obvious gallstones are present. Liver, spleen, pancreas, adrenal glands are within normal limits. Kidneys are within normal limits. Normal appendix. The uterus is markedly lobulated. A left pelvic pedunculated fibroid is suspected. Unremarkable bladder. Small  amount of free fluid is nonspecific in the right hemipelvis and about the liver. Sigmoid diverticulosis. There is suspected to be a 2.0 cm lipoma in the distal descending colon. There is no disproportionate dilatation of proximal colon to suggest obstruction. Lower abdominal ventral hernia contains adipose tissue. Advanced degenerative disc disease at L5-S1. Less prominent degenerative disc disease in the remainder of the lumbar spine levels. Atherosclerotic calcifications of the aorta and iliac vasculature. IMPRESSION: Gallbladder is distended with wall edema. Acalculous cholecystitis is not excluded. Ultrasound may be helpful. Small amount of free fluid is nonspecific. Small lipoma in the descending colon without evidence of bowel  obstruction. Ventral hernia containing adipose tissue. Degenerative disc disease in lumbar spine. Electronically Signed   By: Jolaine Click M.D.   On: 01/09/2016 20:26   US Abdomen Limited  01/09/2016  CLINICAL DATA:  75 year old female with right upper quadrant pain and distended gallbladder on CT scan. EXAM: US ABDOMEN LIMITED - RIGHT UPPER QUADRANT COMPARISON:  CT scan of the abdomen and pelvis obtained earlier today FINDINGS: Gallbladder: Small mobile echogenic foci consistent with cholelithiasis. The gallbladder wall is thickened measuring up to 5 mm. There is a small amount of pericholecystic fluid. Per the sonographer, the sonographic Eulah Pont sign is negative. Common bile duct: Diameter: Within normal limits at 6- 7 mm. Liver: No focal lesion identified. Within normal limits in parenchymal echogenicity. IMPRESSION: Cholelithiasis with gallbladder wall thickening and trace pericholecystic fluid. Sonographic findings can be consistent with acute cholecystitis in the appropriate clinical setting. Of note, the sonographic Eulah Pont sign was reportedly negative. Electronically Signed   By: Malachy Moan M.D.   On: 01/09/2016 22:15   Dg Chest Port 1 View  01/15/2016  CLINICAL DATA:   Productive cough for 2 days EXAM: PORTABLE CHEST 1 VIEW COMPARISON:  07/29/2013 FINDINGS: The heart size and mediastinal contours are within normal limits. Both lungs are clear. The visualized skeletal structures are unremarkable. IMPRESSION: No active disease. Electronically Signed   By: Alcide Clever M.D.   On: 01/15/2016 16:48    Micro Results   Recent Results (from the past 240 hour(s))  Urine culture     Status: None   Collection Time: 01/09/16  6:34 PM  Result Value Ref Range Status   Specimen Description URINE, CLEAN CATCH  Final   Special Requests NONE  Final   Culture   Final    1,000 COLONIES/mL INSIGNIFICANT GROWTH Performed at [facility]    Report Status 01/11/2016 FINAL  Final  Culture, blood (Routine X 2) w Reflex to ID Panel     Status: None (Preliminary result)   Collection Time: 01/11/16  9:21 PM  Result Value Ref Range Status   Specimen Description BLOOD LEFT HAND  Final   Special Requests IN PEDIATRIC BOTTLE 2CC  Final   Culture   Final    NO GROWTH 3 DAYS Performed at [facility]    Report Status PENDING  Incomplete  Culture, blood (Routine X 2) w Reflex to ID Panel     Status: None (Preliminary result)   Collection Time: 01/11/16  9:30 PM  Result Value Ref Range Status   Specimen Description BLOOD RIGHT ANTECUBITAL  Final   Special Requests BOTTLES DRAWN AEROBIC AND ANAEROBIC 5CC  Final   Culture   Final    NO GROWTH 3 DAYS Performed at [facility]    Report Status PENDING  Incomplete  Surgical pcr screen     Status: None   Collection Time: 01/12/16  2:01 PM  Result Value Ref Range Status   MRSA, PCR NEGATIVE NEGATIVE Final   Staphylococcus aureus NEGATIVE NEGATIVE Final    Comment:        The Xpert SA Assay (FDA approved for NASAL specimens in patients over 34 years of age), is one component of a comprehensive surveillance program.  Test performance has been validated by [facility] for patients greater than or equal to  28 year old. It is not intended to diagnose infection nor to guide or monitor treatment.        Today   Subjective:   Nadine Ryle denies  any complaints..   Objective:   Blood pressure 145/60, pulse 64, temperature 97.6 F (36.4 C), temperature source Oral, resp. rate 18, height 5\' 6"  (1.676 m), weight 68.04 kg (150 lb), SpO2 100 %.   Intake/Output Summary (Last 24 hours) at 01/16/16 1140 Last data filed at 01/16/16 0852  Gross per 24 hour  Intake    956 ml  Output    201 ml  Net    755 ml    Exam Unremarkable  Data Review   CBC w Diff: Lab Results  Component Value Date   WBC 4.4 01/16/2016   WBC 7.1 08/23/2015   HGB 12.8 01/16/2016   HCT 38.0 01/16/2016   HCT 39.3 08/23/2015   PLT 226 01/16/2016   PLT 386* 08/23/2015   LYMPHOPCT 12 01/14/2016   MONOPCT 12 01/14/2016   EOSPCT 2 01/14/2016   BASOPCT 1 01/14/2016    CMP: Lab Results  Component Value Date   NA 139 01/16/2016   NA 139 08/23/2015   K 3.5 01/16/2016   CL 101 01/16/2016   CO2 29 01/16/2016   BUN 13 01/16/2016   BUN 12 08/23/2015   CREATININE 0.67 01/16/2016   CREATININE 0.68 06/16/2014   PROT 6.0* 01/15/2016   PROT 6.7 08/23/2015   ALBUMIN 3.0* 01/15/2016   ALBUMIN 4.5 08/23/2015   BILITOT 0.4 01/15/2016   BILITOT <0.2 08/23/2015   ALKPHOS 84 01/15/2016   AST 46* 01/15/2016   ALT 38 01/15/2016  .   Total Time in preparing paper work, data evaluation and todays exam - 35 minutes  Jamiesha Victoria M.D on 01/16/2016 at 11:40 AM  [facility] Office  [phone]

## 2016-01-16 NOTE — Progress Notes (Signed)
Pt with continued agitation shaking top rails and swearing required more time and attention from staff. Pt is combative at times and physically aggressive towards staff. Prn medication for anxiety given. Relaxation techniques and classical music played to help calm patients demeanor. Soft lightning provided, staff rotated to keep patient calm and free from harm. Monitoring continued per doctor order and unit protocol.

## 2016-01-17 ENCOUNTER — Inpatient Hospital Stay (HOSPITAL_COMMUNITY): Payer: Medicare Other

## 2016-01-17 DIAGNOSIS — F0281 Dementia in other diseases classified elsewhere with behavioral disturbance: Secondary | ICD-10-CM

## 2016-01-17 DIAGNOSIS — K851 Biliary acute pancreatitis without necrosis or infection: Secondary | ICD-10-CM

## 2016-01-17 DIAGNOSIS — K819 Cholecystitis, unspecified: Secondary | ICD-10-CM

## 2016-01-17 DIAGNOSIS — G308 Other Alzheimer's disease: Secondary | ICD-10-CM

## 2016-01-17 LAB — CULTURE, BLOOD (ROUTINE X 2)
CULTURE: NO GROWTH
CULTURE: NO GROWTH

## 2016-01-17 LAB — GLUCOSE, CAPILLARY
GLUCOSE-CAPILLARY: 176 mg/dL — AB (ref 65–99)
GLUCOSE-CAPILLARY: 178 mg/dL — AB (ref 65–99)
Glucose-Capillary: 113 mg/dL — ABNORMAL HIGH (ref 65–99)

## 2016-01-17 MED ORDER — BISACODYL 10 MG RE SUPP: 10.0000 mg | Freq: Once | RECTAL | Status: DC

## 2016-01-17 MED ORDER — HALOPERIDOL LACTATE 5 MG/ML IJ SOLN
2.0000 mg | Freq: Once | INTRAMUSCULAR | Status: AC
  Administered 2016-01-17: 2 mg via INTRAMUSCULAR
  Filled 2016-01-17: qty 1

## 2016-01-17 MED ORDER — BISACODYL 5 MG PO TBEC
5.0000 mg | DELAYED_RELEASE_TABLET | Freq: Once | ORAL | Status: AC
  Administered 2016-01-17: 5 mg via ORAL
  Filled 2016-01-17: qty 1

## 2016-01-17 NOTE — Progress Notes (Signed)
Called report to Olu at [facility]. Patient to be transferred to facility via daughter's private vehicle. Prescriptions were given to the daughter.

## 2016-01-17 NOTE — Progress Notes (Signed)
[facility] PROGRESS NOTE  Amy [name]:[id number] DOB: [birth year] DOA: 01/09/2016 PCP: Tommy Rainwater, MD  No changes to discharge summary. See discharge summary dictated by Dr. Venetia Constable 01/16/2016.   Assessment/Plan: #1 acute cholecystectomy Status post laparoscopic cholecystectomy with [facility] 01/12/2016. Patient with clinical improvement. Patient tolerating current oral diet. Patient afebrile. Patient with some complaints of abdominal pain have a KUB unremarkable. Continue Augmentin. Outpatient follow-up.  #2 gallstone pancreatitis Resolved. Status post laparoscopic cholecystectomy with IOC. Tolerating current diet.  #3 Alzheimer's dementia Patient currently at baseline confusion. Patient noted to be lethargic which was felt to be secondary to psychoactive medications. Patient's Seroquel/Remeron were held and patient maintained on her benzodiazepine as needed. Mentation has improved and patient currently at baseline.   Code Status: Full Family Communication: Updated patient. No family present. Disposition Plan: Discharge to skilled nursing facility today.   Consultants:  Gen. surgery: Dr. Drexel Iha 01/10/2016  Procedures:  Chest x-ray 01/15/2016  Abdominal x-ray 01/17/2016  CT abdomen and pelvis 01/09/2016  Abdominal ultrasound 01/09/2016  Laparoscopic cholecystectomy with IOC 01/12/2016 per Dr. Drexel Iha  Antibiotics:  Augmentin 01/14/2016  IV Zosyn 01/10/2016>>>>>> 01/14/2016  HPI/Subjective: Patient coming from bathroom. No nausea no vomiting. Patient with some complaints of abdominal pain however ate all her lunch. KUB negative. alert with baseline confusion.   Objective: Filed Vitals:   01/17/16 0552 01/17/16 0953  BP: 151/55 156/60  Pulse: 63   Temp: 99 F (37.2 C)   Resp: 18     Intake/Output Summary (Last 24 hours) at 01/17/16 1130 Last data filed at 01/17/16 0947  Gross per 24 hour  Intake    720 ml  Output    550 ml  Net     170 ml   Filed Weights   01/10/16 1700  Weight: 68.04 kg (150 lb)    Exam:   General:  NAD  Cardiovascular: RRR  Respiratory: CTAB  Abdomen: Soft, some tenderness to palpation, nondistended, positive bowel sounds.  Musculoskeletal: No clubbing cyanosis or edema.  Data Reviewed: Basic Metabolic Panel:  Recent Labs Lab 01/12/16 0429 01/13/16 0517 01/14/16 0908 01/15/16 0750 01/16/16 0447  NA 136 138 138 137 139  K 3.1* 3.6 3.5 3.3* 3.5  CL 100* 105 101 98* 101  CO2 GLUCOSE 95 150* 135* 113* 110*  BUN CREATININE 0.68 0.53 0.62 0.71 0.67  CALCIUM 8.5* 8.4* 8.3* 8.2* 9.0  MG  --   --  1.5*  --   --    Liver Function Tests:  Recent Labs Lab 01/11/16 2130 01/12/16 0429 01/13/16 0517 01/14/16 0908 01/15/16 0750  AST 40 32 64* 50* 46*  ALT 43 37 49 46 38  ALKPHOS 107 95 101 91 84  BILITOT 1.1 1.1 0.5 0.7 0.4  PROT 6.7 5.9* 6.0* 6.3* 6.0*  ALBUMIN 3.6 3.1* 3.1* 3.3* 3.0*    Recent Labs Lab 01/11/16 0100 01/12/16 0429  LIPASE 44 19    Recent Labs Lab 01/11/16 2130  AMMONIA 21   CBC:  Recent Labs Lab 01/12/16 0429 01/13/16 0517 01/14/16 0908 01/15/16 0750 01/16/16 0447  WBC 8.8 5.2 4.2 3.2* 4.4  NEUTROABS  --   --  3.1  --   --   HGB 11.7* 11.3* 10.9* 12.6 12.8  HCT 34.6* 33.8* 33.1* 38.8 38.0  MCV 94.0 94.4 94.8 95.8 92.5  PLT 257 250 228 246 226   Cardiac Enzymes: No results for input(s): CKTOTAL, CKMB, CKMBINDEX,  TROPONINI in the last 168 hours. BNP (last 3 results) No results for input(s): BNP in the last 8760 hours.  ProBNP (last 3 results) No results for input(s): PROBNP in the last 8760 hours.  CBG:  Recent Labs Lab 01/16/16 0748 01/16/16 1134 01/16/16 1635 01/16/16 2140 01/17/16 0738  GLUCAP 119* 162* 136* 147* 113*    Recent Results (from the past 240 hour(s))  Urine culture     Status: None   Collection Time: 01/09/16  6:34 PM  Result Value Ref Range Status   Specimen Description  URINE, CLEAN CATCH  Final   Special Requests NONE  Final   Culture   Final    1,000 COLONIES/mL INSIGNIFICANT GROWTH Performed at [facility]    Report Status 01/11/2016 FINAL  Final  Culture, blood (Routine X 2) w Reflex to ID Panel     Status: None (Preliminary result)   Collection Time: 01/11/16  9:21 PM  Result Value Ref Range Status   Specimen Description BLOOD LEFT HAND  Final   Special Requests IN PEDIATRIC BOTTLE 2CC  Final   Culture   Final    NO GROWTH 4 DAYS Performed at [facility]    Report Status PENDING  Incomplete  Culture, blood (Routine X 2) w Reflex to ID Panel     Status: None (Preliminary result)   Collection Time: 01/11/16  9:30 PM  Result Value Ref Range Status   Specimen Description BLOOD RIGHT ANTECUBITAL  Final   Special Requests BOTTLES DRAWN AEROBIC AND ANAEROBIC 5CC  Final   Culture   Final    NO GROWTH 4 DAYS Performed at [facility]    Report Status PENDING  Incomplete  Surgical pcr screen     Status: None   Collection Time: 01/12/16  2:01 PM  Result Value Ref Range Status   MRSA, PCR NEGATIVE NEGATIVE Final   Staphylococcus aureus NEGATIVE NEGATIVE Final    Comment:        The Xpert SA Assay (FDA approved for NASAL specimens in patients over 107 years of age), is one component of a comprehensive surveillance program.  Test performance has been validated by [facility] for patients greater than or equal to 88 year old. It is not intended to diagnose infection nor to guide or monitor treatment.      Studies: Dg Chest Port 1 View  01/15/2016  CLINICAL DATA:  Productive cough for 2 days EXAM: PORTABLE CHEST 1 VIEW COMPARISON:  07/29/2013 FINDINGS: The heart size and mediastinal contours are within normal limits. Both lungs are clear. The visualized skeletal structures are unremarkable. IMPRESSION: No active disease. Electronically Signed   By: Alcide Clever M.D.   On: 01/15/2016 16:48    Scheduled Meds: .  ALPRAZolam  0.25 mg Oral QHS  . amoxicillin-clavulanate  1 tablet Oral Q12H  . aspirin  81 mg Oral Daily  . bisacodyl  10 mg Rectal Once  . cholecalciferol  1,000 Units Oral Daily  . enalapril  20 mg Oral q morning - 10a  . enoxaparin (LOVENOX) injection  40 mg Subcutaneous QHS  . insulin aspart  0-9 Units Subcutaneous TID WC  . memantine  21 mg Oral Daily  . pantoprazole  40 mg Oral Daily  . potassium chloride  40 mEq Oral Once  . saccharomyces boulardii  250 mg Oral Daily   Continuous Infusions:   Active Problems:   Hyperlipidemia   Diabetes mellitus type 2, controlled (HCC)   HTN (hypertension)  Alzheimer's disease    Time spent: 63 MINS    [facility] MD [facility] Pager [phone]. If 7PM-7AM, please contact night-coverage at [email or web address], password [facility] 01/17/2016, 11:30 AM  LOS: 8 days

## 2016-01-17 NOTE — Progress Notes (Signed)
Pt  keeps on getting out of her bed,shouting,agitated and physically aggressive towards staff. Prn anxiety medicine and pain medicine was given earlier. Notify on call provider and and was given an order  For Haldol 2 mg IM once.

## 2016-01-17 NOTE — Progress Notes (Signed)
Patient is set to discharge to [facility] today. RN, Darl Pikes, patient & daughter, Tresa Endo aware. Daughter to transport to SNF.     Lincoln Maxin, LCSW [facility] Clinical Social Worker cell #: [phone]

## 2016-01-19 ENCOUNTER — Non-Acute Institutional Stay (SKILLED_NURSING_FACILITY): Payer: Medicare Other | Admitting: Internal Medicine

## 2016-01-19 ENCOUNTER — Encounter: Payer: Self-pay | Admitting: Internal Medicine

## 2016-01-19 DIAGNOSIS — F419 Anxiety disorder, unspecified: Secondary | ICD-10-CM | POA: Diagnosis not present

## 2016-01-19 DIAGNOSIS — K8000 Calculus of gallbladder with acute cholecystitis without obstruction: Secondary | ICD-10-CM | POA: Diagnosis not present

## 2016-01-19 DIAGNOSIS — F0281 Dementia in other diseases classified elsewhere with behavioral disturbance: Secondary | ICD-10-CM | POA: Diagnosis not present

## 2016-01-19 DIAGNOSIS — K851 Biliary acute pancreatitis without necrosis or infection: Secondary | ICD-10-CM | POA: Diagnosis not present

## 2016-01-19 DIAGNOSIS — G301 Alzheimer's disease with late onset: Secondary | ICD-10-CM

## 2016-01-19 DIAGNOSIS — R4182 Altered mental status, unspecified: Secondary | ICD-10-CM | POA: Insufficient documentation

## 2016-01-19 DIAGNOSIS — F02818 Dementia in other diseases classified elsewhere, unspecified severity, with other behavioral disturbance: Secondary | ICD-10-CM

## 2016-01-19 DIAGNOSIS — K219 Gastro-esophageal reflux disease without esophagitis: Secondary | ICD-10-CM | POA: Diagnosis not present

## 2016-01-19 DIAGNOSIS — E782 Mixed hyperlipidemia: Secondary | ICD-10-CM | POA: Diagnosis not present

## 2016-01-19 DIAGNOSIS — R400 Somnolence: Secondary | ICD-10-CM | POA: Diagnosis not present

## 2016-01-19 DIAGNOSIS — E119 Type 2 diabetes mellitus without complications: Secondary | ICD-10-CM

## 2016-01-19 DIAGNOSIS — E559 Vitamin D deficiency, unspecified: Secondary | ICD-10-CM

## 2016-01-19 NOTE — Progress Notes (Signed)
MRN: [id number] Name: Amy [name]  Sex: female Age: 75 y.o. DOB: 1941-10-22  [facility] Facility/Room:103 Level Of Care: SNF Provider: Merrilee Seashore D Emergency Contacts: Extended Emergency Contact Information Primary Emergency Contact: Chaya Jan Address: [address]          [place], [place] [address] D[place] of [place] Home Phone: [phone] Mobile Phone: [phone] Relation: Daughter  Code Status:   Allergies: Latex; Lipitor; and Shellfish-derived products  Chief Complaint  Patient presents with  . New Admit To SNF    HPI: Patient is 75 y.o. female with Alzheimer's dementia/type 2 diabetes mellitus/hyperlipidemia/history of gallstones will came in with abdominal pain associated with worsening confusion and fever, and she was found to have acute gallstone pancreatitis with acute cholecystitis. She was admitted to [facility] from 2/14-22 where she underwent  laparoscopic cholecystectomy performed by Dr. Sheliah Hatch. Pt's hospital course was complicated by lethargy felt to be related to her dementia/mood medications and they were adjusted. Pt is admitted to SNF with generalized weakness for post-op care. While at SNF pt will be followed for dementia, tx with namenda, DM, tx with gluophage and HLD tx with fenofibrate.  Past Medical History  Diagnosis Date  . Type 2 diabetes mellitus (HCC)   . Hypercholesterolemia with hyperglyceridemia   . HTN (hypertension)   . Chest pain     adenosine myoview (10/10) showed EF 72% and ischemia in the apical anterior wall and true apex. LHC (11/10): EF 60%, LVEDP 11, separate ostia for LAD and CFX, mild luminal irregularities in the LAD, no obstructive disease  . Anxiety   . Depression   . Dementia of the Alzheimer's type   . Alzheimer's disease 02/22/2015    Past Surgical History  Procedure Laterality Date  . Colonoscopy      07/02/2010  . Cardiac catheterization  2010    negative  . Tubal ligation    .  Cataract extraction Bilateral   . Cholecystectomy N/A 01/12/2016    Procedure: LAPAROSCOPIC CHOLECYSTECTOMY WITH INTRAOPERATIVE CHOLANGIOGRAM;  Surgeon: De Blanch Kinsinger, MD;  Location: [facility];  Service: General;  Laterality: N/A;      Medication List       This list is accurate as of: 01/19/16  7:57 PM.  Always use your most recent med list.               acetaminophen 325 MG tablet  Commonly known as:  TYLENOL  Take 650 mg by mouth every 4 (four) hours as needed for moderate pain.     ALPRAZolam 0.25 MG tablet  Commonly known as:  XANAX  Take 1 tablet (0.25 mg total) by mouth every 4 (four) hours as needed for anxiety.     amoxicillin-clavulanate 875-125 MG tablet  Commonly known as:  AUGMENTIN  Take 1 tablet by mouth every 12 (twelve) hours.     aspirin 81 MG tablet  Take 81 mg by mouth daily.     BAYER CONTOUR TEST test strip  Generic drug:  glucose blood  USE TO CHECK GLUCOSE EVERY DAY     busPIRone 10 MG tablet  Commonly known as:  BUSPAR  Take 10 mg by mouth 3 (three) times daily.     cetirizine 10 MG tablet  Commonly known as:  ZYRTEC  Take 10 mg by mouth daily as needed for allergies.     cholecalciferol 1000 units tablet  Commonly known as:  VITAMIN D  Take 1,000 Units by mouth daily.  enalapril 20 MG tablet  Commonly known as:  VASOTEC  TAKE 1 TABLET BY MOUTH EVERY MORNING     fenofibrate micronized 134 MG capsule  Commonly known as:  LOFIBRA  TAKE ONE CAPSULE BY MOUTH DAILY     Melatonin 5 MG Tabs  Take 10 mg by mouth at bedtime.     metFORMIN 500 MG tablet  Commonly known as:  GLUCOPHAGE  Take 500 mg by mouth daily.     NAMENDA XR 21 MG Cp24  Generic drug:  Memantine HCl ER  Take 1 tablet by mouth daily.     pantoprazole 40 MG tablet  Commonly known as:  PROTONIX  Take 1 tablet (40 mg total) by mouth daily.     saccharomyces boulardii 250 MG capsule  Commonly known as:  FLORASTOR  Take 250 mg by mouth daily. 14 Day Course.      sertraline 25 MG tablet  Commonly known as:  ZOLOFT  Take 25 mg by mouth daily.        No orders of the defined types were placed in this encounter.    Immunization History  Administered Date(s) Administered  . Pneumococcal-Unspecified 12/19/2006  . Tdap 12/19/2008  . Zoster 12/19/2008    Social History  Substance Use Topics  . Smoking status: Former Smoker -- 5 years    Quit date: 10/20/1987  . Smokeless tobacco: Never Used  . Alcohol Use: No    Family history is HTN, breast CA    Review of Systems UTO 2/2 confusion; per nursing - confusion, always trying to stand up     Filed Vitals:   01/19/16 1025  BP: 131/73  Pulse: 70  Temp: 98.1 F (36.7 C)  Resp: 19    SpO2 Readings from Last 1 Encounters:  01/17/16 97%        Physical Exam  GENERAL APPEARANCE: Alert, conversant,  No acute distress.  SKIN: No diaphoresis rash HEAD: Normocephalic, atraumatic  EYES: Conjunctiva/lids clear. Pupils round, reactive. EOMs intact.  EARS: External exam WNL, canals clear. Hearing grossly normal.  NOSE: No deformity or discharge.  MOUTH/THROAT: Lips w/o lesions  RESPIRATORY: Breathing is even, unlabored. Lung sounds are clear   CARDIOVASCULAR: Heart RRR no murmurs, rubs or gallops. No peripheral edema.   GASTROINTESTINAL: Abdomen is soft, non-tender, not distended w/ normal bowel sounds. GENITOURINARY: Bladder non tender, not distended  MUSCULOSKELETAL: No abnormal joints or musculature NEUROLOGIC:  Cranial nerves 2-12 grossly intact. Moves all extremities  PSYCHIATRIC: confusion, not able to follow commands  Patient Active Problem List   Diagnosis Date Noted  . Acute calculous cholecystitis 01/19/2016  . Acute gallstone pancreatitis 01/19/2016  . Mental status change 01/19/2016  . GERD (gastroesophageal reflux disease) 01/19/2016  . Anxiety 01/19/2016  . Alzheimer's disease 02/22/2015  . Encounter for long-term (current) use of other medications 03/16/2014  .  SDAT 03/16/2014  . Vitamin D deficiency 03/16/2014  . Diabetes mellitus type 2, controlled (HCC)   . HTN (hypertension)   . Hyperlipidemia 09/29/2009    CBC    Component Value Date/Time   WBC 4.4 01/16/2016 0447   WBC 7.1 08/23/2015 1459   RBC 4.11 01/16/2016 0447   RBC 4.28 08/23/2015 1459   HGB 12.8 01/16/2016 0447   HCT 38.0 01/16/2016 0447   HCT 39.3 08/23/2015 1459   PLT 226 01/16/2016 0447   PLT 386* 08/23/2015 1459   MCV 92.5 01/16/2016 0447   MCV 92 08/23/2015 1459   LYMPHSABS 0.5* 01/14/2016 0908  LYMPHSABS 2.1 08/23/2015 1459   MONOABS 0.5 01/14/2016 0908   EOSABS 0.1 01/14/2016 0908   EOSABS 0.2 08/23/2015 1459   BASOSABS 0.0 01/14/2016 0908   BASOSABS 0.1 08/23/2015 1459    CMP     Component Value Date/Time   NA 139 01/16/2016 0447   NA 139 08/23/2015 1459   K 3.5 01/16/2016 0447   CL 101 01/16/2016 0447   CO2 29 01/16/2016 0447   GLUCOSE 110* 01/16/2016 0447   GLUCOSE 185* 08/23/2015 1459   BUN 13 01/16/2016 0447   BUN 12 08/23/2015 1459   CREATININE 0.67 01/16/2016 0447   CREATININE 0.68 06/16/2014 1013   CALCIUM 9.0 01/16/2016 0447   PROT 6.0* 01/15/2016 0750   PROT 6.7 08/23/2015 1459   ALBUMIN 3.0* 01/15/2016 0750   ALBUMIN 4.5 08/23/2015 1459   AST 46* 01/15/2016 0750   ALT 38 01/15/2016 0750   ALKPHOS 84 01/15/2016 0750   BILITOT 0.4 01/15/2016 0750   BILITOT <0.2 08/23/2015 1459   GFRNONAA >60 01/16/2016 0447   GFRNONAA 88 06/16/2014 1013   GFRAA >60 01/16/2016 0447   GFRAA >89 06/16/2014 1013    Lab Results  Component Value Date   HGBA1C 7.0* 06/16/2014     Ct Abdomen Pelvis W Contrast  01/09/2016  CLINICAL DATA:  Abdominal pain EXAM: CT ABDOMEN AND PELVIS WITH CONTRAST TECHNIQUE: Multidetector CT imaging of the abdomen and pelvis was performed using the standard protocol following bolus administration of intravenous contrast. CONTRAST:  OMNIPAQUE IOHEXOL 300 MG/ML SOLN, 25mL OMNIPAQUE IOHEXOL 300 MG/ML SOLN COMPARISON:   None. FINDINGS: The gallbladder is distended. There is wall thickening and wall edema. No obvious gallstones are present. Liver, spleen, pancreas, adrenal glands are within normal limits. Kidneys are within normal limits. Normal appendix. The uterus is markedly lobulated. A left pelvic pedunculated fibroid is suspected. Unremarkable bladder. Small amount of free fluid is nonspecific in the right hemipelvis and about the liver. Sigmoid diverticulosis. There is suspected to be a 2.0 cm lipoma in the distal descending colon. There is no disproportionate dilatation of proximal colon to suggest obstruction. Lower abdominal ventral hernia contains adipose tissue. Advanced degenerative disc disease at L5-S1. Less prominent degenerative disc disease in the remainder of the lumbar spine levels. Atherosclerotic calcifications of the aorta and iliac vasculature. IMPRESSION: Gallbladder is distended with wall edema. Acalculous cholecystitis is not excluded. Ultrasound may be helpful. Small amount of free fluid is nonspecific. Small lipoma in the descending colon without evidence of bowel obstruction. Ventral hernia containing adipose tissue. Degenerative disc disease in lumbar spine. Electronically Signed   By: Jolaine Click M.D.   On: 01/09/2016 20:26   US Abdomen Limited  01/09/2016  CLINICAL DATA:  75 year old female with right upper quadrant pain and distended gallbladder on CT scan. EXAM: US ABDOMEN LIMITED - RIGHT UPPER QUADRANT COMPARISON:  CT scan of the abdomen and pelvis obtained earlier today FINDINGS: Gallbladder: Small mobile echogenic foci consistent with cholelithiasis. The gallbladder wall is thickened measuring up to 5 mm. There is a small amount of pericholecystic fluid. Per the sonographer, the sonographic Eulah Pont sign is negative. Common bile duct: Diameter: Within normal limits at 6- 7 mm. Liver: No focal lesion identified. Within normal limits in parenchymal echogenicity. IMPRESSION: Cholelithiasis with  gallbladder wall thickening and trace pericholecystic fluid. Sonographic findings can be consistent with acute cholecystitis in the appropriate clinical setting. Of note, the sonographic Eulah Pont sign was reportedly negative. Electronically Signed   By: Isac Caddy.D.  On: 01/09/2016 22:15    Not all labs, radiology exams or other studies done during hospitalization come through on my EPIC note; however they are reviewed by me.    Assessment and Plan  Acute calculous cholecystitis After much deliberation on the part of the family pt underwent laproscopic cholecystectomy by Dr Sheliah Hatch; tx with zosyn , then augmentin ; SNF - cont augmentin 3 more days  Acute gallstone pancreatitis Lipase up to 3000 , resolved after GB was removed.  Mental status change lethatgy felt to be 2/2 seroquel and remeron, which were withheld but pt put on xanax?  Alzheimer's disease SNF - cont namenda for the moment;pt had been on exelon and stable on it at home;will consult psych; pt is probably better off on meds she had been stable on prior to hospitalization  Diabetes mellitus type 2, controlled (HCC) SNF - not stated as uncontrolled ; cont glucophage 500 mg daily; pt on ACE and fenofibrate  Hyperlipidemia SNF - not stated as uncontrolled; cont fenofibrate  Vitamin D deficiency SNF - cont replacement , 1000u daily  GERD (gastroesophageal reflux disease) SNF - cont protonix 40 mg daily  Anxiety SNF - pt was on buspar then placed on xanax; will have psych review all meds, try to get her back to pre hospital regimen on which ZI understand she was stable   Time spent . 45 min;> 50% of time with patient was spent reviewing records, labs, tests and studies, counseling and developing plan of care  Margit Hanks, MD

## 2016-01-19 NOTE — Assessment & Plan Note (Signed)
SNF - pt was on buspar then placed on xanax; will have psych review all meds, try to get her back to pre hospital regimen on which ZI understand she was stable

## 2016-01-19 NOTE — Assessment & Plan Note (Signed)
SNF - cont namenda for the moment;pt had been on exelon and stable on it at home;will consult psych; pt is probably better off on meds she had been stable on prior to hospitalization

## 2016-01-19 NOTE — Assessment & Plan Note (Signed)
Lipase up to 3000 , resolved after GB was removed.

## 2016-01-19 NOTE — Assessment & Plan Note (Signed)
SNF - not stated as uncontrolled ; cont glucophage 500 mg daily; pt on ACE and fenofibrate

## 2016-01-19 NOTE — Assessment & Plan Note (Addendum)
After much deliberation on the part of the family pt underwent laproscopic cholecystectomy by Dr Sheliah Hatch; tx with zosyn , then augmentin ; SNF - cont augmentin 3 more days

## 2016-01-19 NOTE — Assessment & Plan Note (Signed)
lethatgy felt to be 2/2 seroquel and remeron, which were withheld but pt put on xanax?

## 2016-01-19 NOTE — Assessment & Plan Note (Signed)
SNF - not stated as uncontrolled; cont fenofibrate

## 2016-01-19 NOTE — Assessment & Plan Note (Signed)
SNF - cont protonix 40 mg daily 

## 2016-01-19 NOTE — Assessment & Plan Note (Signed)
SNF - cont replacement , 1000u daily

## 2016-01-26 ENCOUNTER — Encounter: Payer: Self-pay | Admitting: Internal Medicine

## 2016-01-26 ENCOUNTER — Non-Acute Institutional Stay (SKILLED_NURSING_FACILITY): Payer: Medicare Other | Admitting: Internal Medicine

## 2016-01-26 DIAGNOSIS — I10 Essential (primary) hypertension: Secondary | ICD-10-CM | POA: Diagnosis not present

## 2016-01-26 DIAGNOSIS — F0281 Dementia in other diseases classified elsewhere with behavioral disturbance: Secondary | ICD-10-CM

## 2016-01-26 DIAGNOSIS — E559 Vitamin D deficiency, unspecified: Secondary | ICD-10-CM

## 2016-01-26 DIAGNOSIS — F419 Anxiety disorder, unspecified: Secondary | ICD-10-CM | POA: Diagnosis not present

## 2016-01-26 DIAGNOSIS — G301 Alzheimer's disease with late onset: Secondary | ICD-10-CM

## 2016-01-26 DIAGNOSIS — E782 Mixed hyperlipidemia: Secondary | ICD-10-CM

## 2016-01-26 DIAGNOSIS — K851 Biliary acute pancreatitis without necrosis or infection: Secondary | ICD-10-CM | POA: Diagnosis not present

## 2016-01-26 DIAGNOSIS — K219 Gastro-esophageal reflux disease without esophagitis: Secondary | ICD-10-CM

## 2016-01-26 DIAGNOSIS — E119 Type 2 diabetes mellitus without complications: Secondary | ICD-10-CM | POA: Diagnosis not present

## 2016-01-26 DIAGNOSIS — K8000 Calculus of gallbladder with acute cholecystitis without obstruction: Secondary | ICD-10-CM | POA: Diagnosis not present

## 2016-01-26 DIAGNOSIS — F02818 Dementia in other diseases classified elsewhere, unspecified severity, with other behavioral disturbance: Secondary | ICD-10-CM

## 2016-01-26 NOTE — Progress Notes (Signed)
MRN: [id number] Name: Amy [name]  Sex: female Age: 75 y.o. DOB: 02/15/1941  [facility] Facility/Room:104 Level Of Care: SNF Provider: Merrilee Seashore D Emergency Contacts: Extended Emergency Contact Information Primary Emergency Contact: Chaya Jan Address: [address]          [place], [place] [address] [place] of [place] Home Phone: [phone] Mobile Phone: [phone] Relation: Daughter  Code Status:   Allergies: Latex; Lipitor; and Shellfish-derived products  Chief Complaint  Patient presents with  . Discharge Note    HPI: Patient is 75 y.o. female with Alzheimer's dementia/type 2 diabetes mellitus/hyperlipidemia/history of gallstones who  was admitted to [facility] from 2/14-22 where she underwent laparoscopic cholecystectomy performed by Dr. Sheliah Hatch. Pt's hospital course was complicated by lethargy felt to be related to her dementia/mood medications and they were adjusted. Pt was admitted to SNF with generalized weakness for post-op care. Pt is now ready to be discharged to home.  Past Medical History  Diagnosis Date  . Type 2 diabetes mellitus (HCC)   . Hypercholesterolemia with hyperglyceridemia   . HTN (hypertension)   . Chest pain     adenosine myoview (10/10) showed EF 72% and ischemia in the apical anterior wall and true apex. LHC (11/10): EF 60%, LVEDP 11, separate ostia for LAD and CFX, mild luminal irregularities in the LAD, no obstructive disease  . Anxiety   . Depression   . Dementia of the Alzheimer's type   . Alzheimer's disease 02/22/2015    Past Surgical History  Procedure Laterality Date  . Colonoscopy      07/02/2010  . Cardiac catheterization  2010    negative  . Tubal ligation    . Cataract extraction Bilateral   . Cholecystectomy N/A 01/12/2016    Procedure: LAPAROSCOPIC CHOLECYSTECTOMY WITH INTRAOPERATIVE CHOLANGIOGRAM;  Surgeon: De Blanch Kinsinger, MD;  Location: [facility];  Service: General;  Laterality: N/A;       Medication List       This list is accurate as of: 01/26/16  4:22 PM.  Always use your most recent med list.               acetaminophen 325 MG tablet  Commonly known as:  TYLENOL  Take 650 mg by mouth every 4 (four) hours as needed for moderate pain.     ALPRAZolam 0.25 MG tablet  Commonly known as:  XANAX  Take 1 tablet (0.25 mg total) by mouth every 4 (four) hours as needed for anxiety.     aspirin 81 MG tablet  Take 81 mg by mouth daily.     BAYER CONTOUR TEST test strip  Generic drug:  glucose blood  USE TO CHECK GLUCOSE EVERY DAY     busPIRone 10 MG tablet  Commonly known as:  BUSPAR  Take 10 mg by mouth 3 (three) times daily.     cetirizine 10 MG tablet  Commonly known as:  ZYRTEC  Take 10 mg by mouth daily as needed for allergies.     cholecalciferol 1000 units tablet  Commonly known as:  VITAMIN D  Take 1,000 Units by mouth daily.     enalapril 20 MG tablet  Commonly known as:  VASOTEC  TAKE 1 TABLET BY MOUTH EVERY MORNING     fenofibrate micronized 134 MG capsule  Commonly known as:  LOFIBRA  TAKE ONE CAPSULE BY MOUTH DAILY     Melatonin 5 MG Tabs  Take 10 mg by mouth at bedtime.  metFORMIN 500 MG tablet  Commonly known as:  GLUCOPHAGE  Take 500 mg by mouth daily.     mirtazapine 30 MG tablet  Commonly known as:  REMERON  Take 30 mg by mouth at bedtime.     NAMENDA XR 21 MG Cp24  Generic drug:  Memantine HCl ER  Take 1 tablet by mouth daily.     pantoprazole 40 MG tablet  Commonly known as:  PROTONIX  Take 1 tablet (40 mg total) by mouth daily.     QUEtiapine 50 MG tablet  Commonly known as:  SEROQUEL  Take 50 mg by mouth at bedtime.     rivastigmine 4.6 mg/24hr  Commonly known as:  EXELON  Place 4.6 mg onto the skin daily.     sertraline 25 MG tablet  Commonly known as:  ZOLOFT  Take 25 mg by mouth daily.        Meds ordered this encounter  Medications  . mirtazapine (REMERON) 30 MG tablet    Sig: Take 30 mg by  mouth at bedtime.  Marland Kitchen. QUEtiapine (SEROQUEL) 50 MG tablet    Sig: Take 50 mg by mouth at bedtime.  . rivastigmine (EXELON) 4.6 mg/24hr    Sig: Place 4.6 mg onto the skin daily.    Immunization History  Administered Date(s) Administered  . Pneumococcal-Unspecified 12/19/2006  . Tdap 12/19/2008  . Zoster 12/19/2008    Social History  Substance Use Topics  . Smoking status: Former Smoker -- 5 years    Quit date: 10/20/1987  . Smokeless tobacco: Never Used  . Alcohol Use: No    Filed Vitals:   01/26/16 1602  BP: 118/70  Pulse: 83  Temp: 98 F (36.7 C)  Resp: 17    Physical Exam  GENERAL APPEARANCE: Alert, conversant. No acute distress.  HEENT: Unremarkable. RESPIRATORY: Breathing is even, unlabored. Lung sounds are clear   CARDIOVASCULAR: Heart RRR no murmurs, rubs or gallops. No peripheral edema.  GASTROINTESTINAL: Abdomen is soft, non-tender, not distended w/ normal bowel sounds.  NEUROLOGIC: Cranial nerves 2-12 grossly intact. Moves all extremities  Patient Active Problem List   Diagnosis Date Noted  . Acute calculous cholecystitis 01/19/2016  . Acute gallstone pancreatitis 01/19/2016  . Mental status change 01/19/2016  . GERD (gastroesophageal reflux disease) 01/19/2016  . Anxiety 01/19/2016  . Alzheimer's disease 02/22/2015  . Encounter for long-term (current) use of other medications 03/16/2014  . SDAT 03/16/2014  . Vitamin D deficiency 03/16/2014  . Diabetes mellitus type 2, controlled (HCC)   . HTN (hypertension)   . Hyperlipidemia 09/29/2009    CBC    Component Value Date/Time   WBC 4.4 01/16/2016 0447   WBC 7.1 08/23/2015 1459   RBC 4.11 01/16/2016 0447   RBC 4.28 08/23/2015 1459   HGB 12.8 01/16/2016 0447   HCT 38.0 01/16/2016 0447   HCT 39.3 08/23/2015 1459   PLT 226 01/16/2016 0447   PLT 386* 08/23/2015 1459   MCV 92.5 01/16/2016 0447   MCV 92 08/23/2015 1459   LYMPHSABS 0.5* 01/14/2016 0908   LYMPHSABS 2.1 08/23/2015 1459   MONOABS 0.5  01/14/2016 0908   EOSABS 0.1 01/14/2016 0908   EOSABS 0.2 08/23/2015 1459   BASOSABS 0.0 01/14/2016 0908   BASOSABS 0.1 08/23/2015 1459    CMP     Component Value Date/Time   NA 139 01/16/2016 0447   NA 139 08/23/2015 1459   K 3.5 01/16/2016 0447   CL 101 01/16/2016 0447   CO2 29 01/16/2016 0447  GLUCOSE 110* 01/16/2016 0447   GLUCOSE 185* 08/23/2015 1459   BUN 13 01/16/2016 0447   BUN 12 08/23/2015 1459   CREATININE 0.67 01/16/2016 0447   CREATININE 0.68 06/16/2014 1013   CALCIUM 9.0 01/16/2016 0447   PROT 6.0* 01/15/2016 0750   PROT 6.7 08/23/2015 1459   ALBUMIN 3.0* 01/15/2016 0750   ALBUMIN 4.5 08/23/2015 1459   AST 46* 01/15/2016 0750   ALT 38 01/15/2016 0750   ALKPHOS 84 01/15/2016 0750   BILITOT 0.4 01/15/2016 0750   BILITOT <0.2 08/23/2015 1459   GFRNONAA >60 01/16/2016 0447   GFRNONAA 88 06/16/2014 1013   GFRAA >60 01/16/2016 0447   GFRAA >89 06/16/2014 1013    Assessment and Plan  Pt is d/c to home with HH/OT/PT/nursing. Medications have been reconciled and Rx's written.   Time spent > 30 min;> 50% of time with patient was spent reviewing records, labs, tests and studies, counseling and developing plan of care  Margit Hanks, MD

## 2016-04-03 ENCOUNTER — Ambulatory Visit (INDEPENDENT_AMBULATORY_CARE_PROVIDER_SITE_OTHER): Payer: Medicare Other | Admitting: Podiatry

## 2016-04-03 ENCOUNTER — Encounter: Payer: Self-pay | Admitting: Podiatry

## 2016-04-03 DIAGNOSIS — L84 Corns and callosities: Secondary | ICD-10-CM

## 2016-04-03 DIAGNOSIS — E119 Type 2 diabetes mellitus without complications: Secondary | ICD-10-CM

## 2016-04-03 NOTE — Patient Instructions (Signed)
Wear the silicone toe cap  Daily on the second right toe and remove at bedtime Return as needed for debridement of the corns (pre-ulcerative corn second right toe) on the second toes  Carrington Clampichard C Tuchman, DPM  Diabetes and Foot Care Diabetes may cause you to have problems because of poor blood supply (circulation) to your feet and legs. This may cause the skin on your feet to become thinner, break easier, and heal more slowly. Your skin may become dry, and the skin may peel and crack. You may also have nerve damage in your legs and feet causing decreased feeling in them. You may not notice minor injuries to your feet that could lead to infections or more serious problems. Taking care of your feet is one of the most important things you can do for yourself.  HOME CARE INSTRUCTIONS  Wear shoes at all times, even in the house. Do not go barefoot. Bare feet are easily injured.  Check your feet daily for blisters, cuts, and redness. If you cannot see the bottom of your feet, use a mirror or ask someone for help.  Wash your feet with warm water (do not use hot water) and mild soap. Then pat your feet and the areas between your toes until they are completely dry. Do not soak your feet as this can dry your skin.  Apply a moisturizing lotion or petroleum jelly (that does not contain alcohol and is unscented) to the skin on your feet and to dry, brittle toenails. Do not apply lotion between your toes.  Trim your toenails straight across. Do not dig under them or around the cuticle. File the edges of your nails with an emery board or nail file.  Do not cut corns or calluses or try to remove them with medicine.  Wear clean socks or stockings every day. Make sure they are not too tight. Do not wear knee-high stockings since they may decrease blood flow to your legs.  Wear shoes that fit properly and have enough cushioning. To break in new shoes, wear them for just a few hours a day. This prevents you from  injuring your feet. Always look in your shoes before you put them on to be sure there are no objects inside.  Do not cross your legs. This may decrease the blood flow to your feet.  If you find a minor scrape, cut, or break in the skin on your feet, keep it and the skin around it clean and dry. These areas may be cleansed with mild soap and water. Do not cleanse the area with peroxide, alcohol, or iodine.  When you remove an adhesive bandage, be sure not to damage the skin around it.  If you have a wound, look at it several times a day to make sure it is healing.  Do not use heating pads or hot water bottles. They may burn your skin. If you have lost feeling in your feet or legs, you may not know it is happening until it is too late.  Make sure your health care provider performs a complete foot exam at least annually or more often if you have foot problems. Report any cuts, sores, or bruises to your health care provider immediately. SEEK MEDICAL CARE IF:   You have an injury that is not healing.  You have cuts or breaks in the skin.  You have an ingrown nail.  You notice redness on your legs or feet.  You feel burning or tingling in  your legs or feet.  You have pain or cramps in your legs and feet.  Your legs or feet are numb.  Your feet always feel cold. SEEK IMMEDIATE MEDICAL CARE IF:   There is increasing redness, swelling, or pain in or around a wound.  There is a red line that goes up your leg.  Pus is coming from a wound.  You develop a fever or as directed by your health care provider.  You notice a bad smell coming from an ulcer or wound.   This information is not intended to replace advice given to you by your health care provider. Make sure you discuss any questions you have with your health care provider.   Document Released: 11/08/2000 Document Revised: 07/14/2013 Document Reviewed: 04/20/2013 Elsevier Interactive Patient Education [employer].

## 2016-04-03 NOTE — Progress Notes (Signed)
Patient ID: Amy [name], female   DOB: February 10, 1941, 75 y.o.   MRN: [id number]  Subjective: This patient presents today with her daughter was requesting debridement of a corn on the second toes. Patient currently wearing a silicone pad on the second right toe somewhat intermittently. Patient was last seen for similar problems on 12/26/2014. Patient lives in assisted living   Objective: Confused patient not able to respond to questioning with daughter present in room  Vascular: DP right 1/4 DP left 2/4 PT pulses 2/4 bilaterally  Neurological: Ankle reflex equal and reactive bilaterally Vibratory sensation patient confused not able to respond Sensation to 10 g monofilament wire patient confused not able to respond  Dermatological: No open skin lesions bilaterally Bleeding within the corn medial second right toe that remains closed after debridement Minimal keratoses medial border second left toe t  Musculoskeletal: HAV deformity left There is no restriction ankle, subtalar, midtarsal joints bilaterally  Assessment: Pre-ulcerative corn second right toe Diabetic without complications  Plan: Debride corns 2 Dispensed silicone toe sleeve to wear over second right toe daily and remove at night  Reappoint at patient's daughter's request

## 2016-04-10 ENCOUNTER — Encounter: Payer: Self-pay | Admitting: Gastroenterology

## 2016-05-13 ENCOUNTER — Emergency Department (HOSPITAL_COMMUNITY): Payer: Medicare Other

## 2016-05-13 ENCOUNTER — Encounter (HOSPITAL_COMMUNITY): Payer: Self-pay

## 2016-05-13 ENCOUNTER — Emergency Department (HOSPITAL_COMMUNITY)
Admission: EM | Admit: 2016-05-13 | Discharge: 2016-05-13 | Disposition: A | Discharge status: Home / Self Care | Payer: Medicare Other | Attending: Emergency Medicine | Admitting: Emergency Medicine

## 2016-05-13 DIAGNOSIS — I10 Essential (primary) hypertension: Secondary | ICD-10-CM | POA: Insufficient documentation

## 2016-05-13 DIAGNOSIS — W1800XA Striking against unspecified object with subsequent fall, initial encounter: Secondary | ICD-10-CM | POA: Insufficient documentation

## 2016-05-13 DIAGNOSIS — Y939 Activity, unspecified: Secondary | ICD-10-CM | POA: Insufficient documentation

## 2016-05-13 DIAGNOSIS — Y929 Unspecified place or not applicable: Secondary | ICD-10-CM | POA: Diagnosis not present

## 2016-05-13 DIAGNOSIS — W19XXXA Unspecified fall, initial encounter: Secondary | ICD-10-CM

## 2016-05-13 DIAGNOSIS — S0083XA Contusion of other part of head, initial encounter: Secondary | ICD-10-CM

## 2016-05-13 DIAGNOSIS — Y999 Unspecified external cause status: Secondary | ICD-10-CM | POA: Diagnosis not present

## 2016-05-13 DIAGNOSIS — Z87891 Personal history of nicotine dependence: Secondary | ICD-10-CM | POA: Insufficient documentation

## 2016-05-13 DIAGNOSIS — Z7982 Long term (current) use of aspirin: Secondary | ICD-10-CM | POA: Insufficient documentation

## 2016-05-13 DIAGNOSIS — Z7984 Long term (current) use of oral hypoglycemic drugs: Secondary | ICD-10-CM | POA: Insufficient documentation

## 2016-05-13 DIAGNOSIS — S81811A Laceration without foreign body, right lower leg, initial encounter: Secondary | ICD-10-CM | POA: Insufficient documentation

## 2016-05-13 DIAGNOSIS — S81812A Laceration without foreign body, left lower leg, initial encounter: Secondary | ICD-10-CM

## 2016-05-13 DIAGNOSIS — E119 Type 2 diabetes mellitus without complications: Secondary | ICD-10-CM | POA: Insufficient documentation

## 2016-05-13 DIAGNOSIS — S8991XA Unspecified injury of right lower leg, initial encounter: Secondary | ICD-10-CM | POA: Diagnosis present

## 2016-05-13 DIAGNOSIS — G309 Alzheimer's disease, unspecified: Secondary | ICD-10-CM | POA: Insufficient documentation

## 2016-05-13 MED ORDER — ZIPRASIDONE MESYLATE 20 MG IM SOLR
20.0000 mg | Freq: Once | INTRAMUSCULAR | Status: AC
  Administered 2016-05-13: 20 mg via INTRAMUSCULAR

## 2016-05-13 MED ORDER — ZIPRASIDONE MESYLATE 20 MG IM SOLR
INTRAMUSCULAR | Status: AC
  Administered 2016-05-13: 20 mg via INTRAMUSCULAR
  Filled 2016-05-13: qty 20

## 2016-05-13 MED ORDER — ALPRAZOLAM 0.25 MG PO TABS: 0.2500 mg | ORAL_TABLET | ORAL | Status: DC | PRN

## 2016-05-13 MED ORDER — QUETIAPINE FUMARATE 50 MG PO TABS: 50.0000 mg | ORAL_TABLET | Freq: Every day | ORAL | Status: DC

## 2016-05-13 NOTE — ED Notes (Signed)
Patient transported to CT 

## 2016-05-13 NOTE — ED Provider Notes (Signed)
CSN: [id number]     Arrival date & time 05/13/16  0048 History   First MD Initiated Contact with Patient 05/13/16 0100     Chief Complaint  Patient presents with  . Fall     (Consider location/radiation/quality/duration/timing/severity/associated sxs/prior Treatment) HPI Comments: This is a 75 year old female transported by EMS from a nursing home after a witnessed fall.  She was in a community room with other residents when she suddenly fell to the floor hitting her left cheek on the floor as well as sustaining a small superficial skin tear to her left shin.  Patient is a 75 y.o. female presenting with fall. The history is provided by the EMS personnel. The history is limited by the condition of the patient.  Fall This is a new problem. The current episode started today. The problem has been unchanged. Pertinent negatives include no fever.    Past Medical History  Diagnosis Date  . Type 2 diabetes mellitus (HCC)   . Hypercholesterolemia with hyperglyceridemia   . HTN (hypertension)   . Chest pain     adenosine myoview (10/10) showed EF 72% and ischemia in the apical anterior wall and true apex. LHC (11/10): EF 60%, LVEDP 11, separate ostia for LAD and CFX, mild luminal irregularities in the LAD, no obstructive disease  . Anxiety   . Depression   . Dementia of the Alzheimer's type   . Alzheimer's disease 02/22/2015   Past Surgical History  Procedure Laterality Date  . Colonoscopy      07/02/2010  . Cardiac catheterization  2010    negative  . Tubal ligation    . Cataract extraction Bilateral   . Cholecystectomy N/A 01/12/2016    Procedure: LAPAROSCOPIC CHOLECYSTECTOMY WITH INTRAOPERATIVE CHOLANGIOGRAM;  Surgeon: De BlanchLuke Aaron Kinsinger, MD;  Location: [facility];  Service: General;  Laterality: N/A;   Family History  Problem Relation Age of Onset  . Coronary artery disease      No premature  . Cancer Mother [id number]    breast  . Hypertension Father   . Stroke Father   . Hypertension  Brother   . Heart disease Brother   . Cancer Sister     breast  . Dementia Paternal Aunt   . Dementia Paternal Aunt   . Dementia Paternal Aunt    Social History  Substance Use Topics  . Smoking status: Former Smoker -- 5 years    Quit date: 10/20/1987  . Smokeless tobacco: Never Used  . Alcohol Use: No   OB History    No data available     Review of Systems  Unable to perform ROS: Dementia  Constitutional: Negative for fever.  Skin: Positive for color change and wound.  All other systems reviewed and are negative.     Allergies  Latex; Lipitor; and Shellfish-derived products  Home Medications   Prior to Admission medications   Medication Sig Start Date End Date Taking? Authorizing Provider  acetaminophen (TYLENOL) 325 MG tablet Take 650 mg by mouth every 4 (four) hours as needed for moderate pain.   Yes Historical Provider, MD  ALPRAZolam (XANAX) 0.25 MG tablet Take 1 tablet (0.25 mg total) by mouth every 4 (four) hours as needed for anxiety. 01/16/16  Yes Simbiso Ranga, MD  aspirin 81 MG tablet Take 81 mg by mouth daily.     Yes Historical Provider, MD  BAYER CONTOUR TEST test strip USE TO CHECK GLUCOSE EVERY DAY 08/23/14  Yes Lucky CowboyWilliam McKeown, MD  busPIRone (BUSPAR) 10 MG tablet  Take 10 mg by mouth 3 (three) times daily.   Yes Historical Provider, MD  cetirizine (ZYRTEC) 10 MG tablet Take 10 mg by mouth daily as needed for allergies.   Yes Historical Provider, MD  cholecalciferol (VITAMIN D) 1000 UNITS tablet Take 1,000 Units by mouth daily.   Yes Historical Provider, MD  DULoxetine (CYMBALTA) 30 MG capsule Take 30 mg by mouth daily.   Yes Historical Provider, MD  enalapril (VASOTEC) 20 MG tablet TAKE 1 TABLET BY MOUTH EVERY MORNING 09/07/14  Yes Lucky Cowboy, MD  Melatonin 5 MG TABS Take 10 mg by mouth at bedtime.   Yes Historical Provider, MD  Memantine HCl ER (NAMENDA XR) 21 MG CP24 Take 1 tablet by mouth daily.   Yes Historical Provider, MD  metFORMIN (GLUCOPHAGE)  500 MG tablet Take 500 mg by mouth daily.    Yes Historical Provider, MD  mirtazapine (REMERON) 30 MG tablet Take 30 mg by mouth at bedtime.   Yes Historical Provider, MD  polyethylene glycol (MIRALAX / GLYCOLAX) packet Take 17 g by mouth daily.   Yes Historical Provider, MD  QUEtiapine (SEROQUEL) 50 MG tablet Take 50 mg by mouth at bedtime.   Yes Historical Provider, MD  rivastigmine (EXELON) 4.6 mg/24hr Place 4.6 mg onto the skin daily.   Yes Historical Provider, MD  fenofibrate micronized (LOFIBRA) 134 MG capsule TAKE ONE CAPSULE BY MOUTH DAILY 10/08/14   Lucky Cowboy, MD  pantoprazole (PROTONIX) 40 MG tablet Take 1 tablet (40 mg total) by mouth daily. 01/16/16   Simbiso Ranga, MD   BP 124/85 mmHg  Pulse 97  Temp(Src) 97.9 F (36.6 C) (Oral)  Resp 16  SpO2 92% Physical Exam  Constitutional: She appears well-developed and well-nourished.  HENT:  Head: Normocephalic.    Eyes: Pupils are equal, round, and reactive to light.  Cardiovascular: Normal rate and regular rhythm.   Pulmonary/Chest: Effort normal.  Abdominal: Soft.  Musculoskeletal: Normal range of motion. She exhibits no edema or tenderness.       Legs: Neurological: She is alert.  Skin: Skin is warm.  Nursing note and vitals reviewed.   ED Course  Procedures (including critical care time) Labs Review Labs Reviewed - No data to display  Imaging Review Ct Head Wo Contrast  05/13/2016  CLINICAL DATA:  Status post fall. Hit left cheek on carpeted floor, with bruising about the left orbit. Initial encounter. EXAM: CT HEAD AND ORBITS WITHOUT CONTRAST TECHNIQUE: Contiguous axial images were obtained from the base of the skull through the vertex without contrast. Multidetector CT imaging of the orbits was performed using the standard protocol without intravenous contrast. COMPARISON:  CT of the head performed 04/24/2012 FINDINGS: CT HEAD FINDINGS There is no evidence of acute infarction, mass lesion, or intra- or extra-axial  hemorrhage on CT. Prominence of the ventricles and sulci reflects moderate cortical volume loss. Cerebellar atrophy is noted. The brainstem and fourth ventricle are within normal limits. The basal ganglia are unremarkable in appearance. The cerebral hemispheres demonstrate grossly normal gray-white differentiation. No mass effect or midline shift is seen. There is no evidence of fracture; visualized osseous structures are unremarkable in appearance. The visualized portions of the orbits are within normal limits. The paranasal sinuses and mastoid air cells are well-aerated. Mild soft tissue swelling is noted inferior to the left orbit. CT ORBITS FINDINGS The orbits are intact bilaterally. The optic globes are symmetric and unremarkable in appearance. The extraocular musculature is within normal limits. No intraorbital hematoma is seen. The  visualized paranasal sinuses and mastoid air cells are well-aerated. There is no evidence of fracture or dislocation. The visualized maxilla appears intact. The nasal bone is unremarkable in appearance. Mild soft tissue swelling is noted inferior to the left orbit, with minimal soft tissue hematoma. The parapharyngeal fat planes are preserved. The visualized portions of the parotid glands are within normal limits. IMPRESSION: 1. No evidence of traumatic intracranial injury or fracture. 2. The orbits are unremarkable in appearance. 3. Mild soft tissue swelling inferior to the left orbit, with minimal soft tissue hematoma. 4. Moderate cortical volume loss noted. Electronically Signed   By: Roanna Raider M.D.   On: 05/13/2016 03:52   Ct Orbitss W/o Cm  05/13/2016  CLINICAL DATA:  Status post fall. Hit left cheek on carpeted floor, with bruising about the left orbit. Initial encounter. EXAM: CT HEAD AND ORBITS WITHOUT CONTRAST TECHNIQUE: Contiguous axial images were obtained from the base of the skull through the vertex without contrast. Multidetector CT imaging of the orbits was  performed using the standard protocol without intravenous contrast. COMPARISON:  CT of the head performed 04/24/2012 FINDINGS: CT HEAD FINDINGS There is no evidence of acute infarction, mass lesion, or intra- or extra-axial hemorrhage on CT. Prominence of the ventricles and sulci reflects moderate cortical volume loss. Cerebellar atrophy is noted. The brainstem and fourth ventricle are within normal limits. The basal ganglia are unremarkable in appearance. The cerebral hemispheres demonstrate grossly normal gray-white differentiation. No mass effect or midline shift is seen. There is no evidence of fracture; visualized osseous structures are unremarkable in appearance. The visualized portions of the orbits are within normal limits. The paranasal sinuses and mastoid air cells are well-aerated. Mild soft tissue swelling is noted inferior to the left orbit. CT ORBITS FINDINGS The orbits are intact bilaterally. The optic globes are symmetric and unremarkable in appearance. The extraocular musculature is within normal limits. No intraorbital hematoma is seen. The visualized paranasal sinuses and mastoid air cells are well-aerated. There is no evidence of fracture or dislocation. The visualized maxilla appears intact. The nasal bone is unremarkable in appearance. Mild soft tissue swelling is noted inferior to the left orbit, with minimal soft tissue hematoma. The parapharyngeal fat planes are preserved. The visualized portions of the parotid glands are within normal limits. IMPRESSION: 1. No evidence of traumatic intracranial injury or fracture. 2. The orbits are unremarkable in appearance. 3. Mild soft tissue swelling inferior to the left orbit, with minimal soft tissue hematoma. 4. Moderate cortical volume loss noted. Electronically Signed   By: Roanna Raider M.D.   On: 05/13/2016 03:52   I have personally reviewed and evaluated these images and lab results as part of my medical decision-making.   EKG  Interpretation None     Patient begin very agitated.  I needed to medicate her with Geodon to facilitate head CT to rule out intracranial damage/fracture/bleed MDM   Final diagnoses:  Fall, initial encounter  Facial contusion, initial encounter  Skin tear of lower leg without complication, right, initial encounter         Earley Favor, NP 05/13/16 0420  Melene Plan, DO 05/13/16 [id number]

## 2016-05-13 NOTE — Discharge Instructions (Signed)
Ms. Amy [name] was evaluated after fall .  She has a facial contusion.  There are no fractures .  Her head CT is normal .  She has a small skin tear on her anterior left shin .  Please wash this with soap and water daily Facial or Scalp Contusion  A facial or scalp contusion is a deep bruise on the face or head. Contusions happen when an injury causes bleeding under the skin. Signs of bruising include pain, puffiness (swelling), and discolored skin. The contusion may turn blue, purple, or yellow. HOME CARE  Only take medicines as told by your doctor.  Put ice on the injured area.  Put ice in a plastic bag.  Place a towel between your skin and the bag.  Leave the ice on for 20 minutes, 2-3 times a day. GET HELP IF:  You have bite problems.  You have pain when chewing.  You are worried about your face not healing normally. GET HELP RIGHT AWAY IF:   You have severe pain or a headache and medicine does not help.  You are very tired or confused, or your personality changes.  You throw up (vomit).  You have a nosebleed that will not stop.  You see two of everything (double vision) or have blurry vision.  You have fluid coming from your nose or ear.  You have problems walking or using your arms or legs. MAKE SURE YOU:   Understand these instructions.  Will watch your condition.  Will get help right away if you are not doing well or get worse.   This information is not intended to replace advice given to you by your health care provider. Make sure you discuss any questions you have with your health care provider.   Document Released: 10/31/2011 Document Revised: 12/02/2014 Document Reviewed: 06/24/2013 Elsevier Interactive Patient Education [employer]2016 [employer]

## 2016-05-13 NOTE — ED Notes (Signed)
Patient attempted to get up from bed.  Patient pull up changed and assisted back to bed.  Bed alarm placed under patient for safety.

## 2016-05-13 NOTE — ED Notes (Signed)
[facility]  Reviewed D/C instructions with Helmut Musterlicia, verbalized understanding.

## 2016-05-13 NOTE — ED Notes (Signed)
Report called to [place] at [facility].

## 2016-05-13 NOTE — ED Notes (Signed)
Per EMS patient from [facility].  Patient had a fall today witnessed by staff.  Patient fell and hit her left cheek on the carpeted floor.  Per EMS patient has bruising to left eye and skin tear to right ankle.   Patient with hx of Alzheimers and per facility has been very agitated today.  VS en route 108/72 PR 100, RR18, CBG 108.  Per facility paperwork patient takes 81mg  aspirin daily.

## 2016-05-13 NOTE — ED Notes (Signed)
[facility] called, patient ready for transport.

## 2016-05-13 NOTE — ED Notes (Signed)
[facility] arrived to transport patient.  Patient belongings bag left in room.  Patient bag with clothing and shoes at the nurses station.

## 2016-05-13 NOTE — ED Notes (Signed)
Bed: WA07 Expected date:  Expected time:  Means of arrival:  Comments: EMS 75yo F Fall

## 2017-01-21 ENCOUNTER — Emergency Department (HOSPITAL_COMMUNITY): Payer: Medicare Other

## 2017-01-21 ENCOUNTER — Emergency Department (HOSPITAL_COMMUNITY)
Admission: EM | Admit: 2017-01-21 | Discharge: 2017-01-21 | Disposition: A | Discharge status: Home / Self Care | Payer: Medicare Other | Attending: Emergency Medicine | Admitting: Emergency Medicine

## 2017-01-21 DIAGNOSIS — Z91040 Latex allergy status: Secondary | ICD-10-CM | POA: Diagnosis not present

## 2017-01-21 DIAGNOSIS — I10 Essential (primary) hypertension: Secondary | ICD-10-CM | POA: Diagnosis not present

## 2017-01-21 DIAGNOSIS — E119 Type 2 diabetes mellitus without complications: Secondary | ICD-10-CM | POA: Insufficient documentation

## 2017-01-21 DIAGNOSIS — Y939 Activity, unspecified: Secondary | ICD-10-CM | POA: Diagnosis not present

## 2017-01-21 DIAGNOSIS — F0280 Dementia in other diseases classified elsewhere without behavioral disturbance: Secondary | ICD-10-CM | POA: Insufficient documentation

## 2017-01-21 DIAGNOSIS — Y929 Unspecified place or not applicable: Secondary | ICD-10-CM | POA: Insufficient documentation

## 2017-01-21 DIAGNOSIS — Z7982 Long term (current) use of aspirin: Secondary | ICD-10-CM | POA: Insufficient documentation

## 2017-01-21 DIAGNOSIS — Z87891 Personal history of nicotine dependence: Secondary | ICD-10-CM | POA: Insufficient documentation

## 2017-01-21 DIAGNOSIS — Z7984 Long term (current) use of oral hypoglycemic drugs: Secondary | ICD-10-CM | POA: Diagnosis not present

## 2017-01-21 DIAGNOSIS — G309 Alzheimer's disease, unspecified: Secondary | ICD-10-CM | POA: Diagnosis not present

## 2017-01-21 DIAGNOSIS — M79601 Pain in right arm: Secondary | ICD-10-CM | POA: Diagnosis present

## 2017-01-21 DIAGNOSIS — Y999 Unspecified external cause status: Secondary | ICD-10-CM | POA: Insufficient documentation

## 2017-01-21 DIAGNOSIS — W19XXXA Unspecified fall, initial encounter: Secondary | ICD-10-CM

## 2017-01-21 DIAGNOSIS — W1839XA Other fall on same level, initial encounter: Secondary | ICD-10-CM | POA: Diagnosis not present

## 2017-01-21 NOTE — ED Triage Notes (Signed)
BIB EMS [facility] post unwitnessed fall, pt c/o right arm pain. Pt denies neck and head pain. No obvious deformity or injury observed. Pt is alert. Hx of dementia.    EMS Vitals BP 117/56 P 66 RR 16 SPO2 96%

## 2017-01-21 NOTE — Discharge Instructions (Signed)
There were no acute abnormalities. Patient is able to stand without difficulty.

## 2017-01-21 NOTE — ED Notes (Signed)
Patient was able to ambulate to bedside commode and back to bed

## 2017-01-21 NOTE — ED Provider Notes (Signed)
[facility]-EMERGENCY DEPT Provider Note   CSN: [id number] Arrival date & time: 01/21/17  1400     History   Chief Complaint Chief Complaint  Patient presents with  . Fall    HPI Amy [name] is a 76 y.o. female.  Patient with history of dementia, diabetes, CAD -- presents from [place] after a witnessed fall. I spoke with nurse she stated that she saw the patient fall from standing out of the corner of her eye. She landed on her right side and was lying on her right arm. She was yelling loudly about having right arm pain. Patient was allowed to remain lying and EMS was called for transport. Upon EMS evaluation, patient seemed to have normal exam without any focal pain or deficits. Level V caveat due to dementia.      Past Medical History:  Diagnosis Date  . Alzheimer's disease 02/22/2015  . Anxiety   . Chest pain    adenosine myoview (10/10) showed EF 72% and ischemia in the apical anterior wall and true apex. LHC (11/10): EF 60%, LVEDP 11, separate ostia for LAD and CFX, mild luminal irregularities in the LAD, no obstructive disease  . Dementia of the Alzheimer's type   . Depression   . HTN (hypertension)   . Hypercholesterolemia with hyperglyceridemia   . Type 2 diabetes mellitus [facility])     Patient Active Problem List   Diagnosis Date Noted  . Acute calculous cholecystitis 01/19/2016  . Acute gallstone pancreatitis 01/19/2016  . Mental status change 01/19/2016  . GERD (gastroesophageal reflux disease) 01/19/2016  . Anxiety 01/19/2016  . Alzheimer's disease 02/22/2015  . Encounter for long-term (current) use of other medications 03/16/2014  . SDAT 03/16/2014  . Vitamin D deficiency 03/16/2014  . Diabetes mellitus type 2, controlled (HCC)   . HTN (hypertension)   . Hyperlipidemia 09/29/2009    Past Surgical History:  Procedure Laterality Date  . CARDIAC CATHETERIZATION  2010   negative  . CATARACT EXTRACTION Bilateral   . CHOLECYSTECTOMY N/A 01/12/2016   Procedure: LAPAROSCOPIC CHOLECYSTECTOMY WITH INTRAOPERATIVE CHOLANGIOGRAM;  Surgeon: De Blanch Kinsinger, MD;  Location: [facility] ORS;  Service: General;  Laterality: N/A;  . COLONOSCOPY     07/02/2010  . TUBAL LIGATION      OB History    No data available       Home Medications    Prior to Admission medications   Medication Sig Start Date End Date Taking? Authorizing Provider  acetaminophen (TYLENOL) 325 MG tablet Take 650 mg by mouth every 4 (four) hours as needed for moderate pain.    Historical Provider, MD  ALPRAZolam Prudy Feeler) 0.25 MG tablet Take 1 tablet (0.25 mg total) by mouth every 4 (four) hours as needed for anxiety. 01/16/16   Simbiso Ranga, MD  aspirin 81 MG tablet Take 81 mg by mouth daily.      Historical Provider, MD  BAYER CONTOUR TEST test strip USE TO CHECK GLUCOSE EVERY DAY 08/23/14   Lucky Cowboy, MD  busPIRone (BUSPAR) 10 MG tablet Take 10 mg by mouth 3 (three) times daily.    Historical Provider, MD  cetirizine (ZYRTEC) 10 MG tablet Take 10 mg by mouth daily as needed for allergies.    Historical Provider, MD  cholecalciferol (VITAMIN D) 1000 UNITS tablet Take 1,000 Units by mouth daily.    Historical Provider, MD  DULoxetine (CYMBALTA) 30 MG capsule Take 30 mg by mouth daily.    Historical Provider, MD  enalapril (VASOTEC) 20 MG tablet TAKE  1 TABLET BY MOUTH EVERY MORNING 09/07/14   Lucky Cowboy, MD  fenofibrate micronized (LOFIBRA) 134 MG capsule TAKE ONE CAPSULE BY MOUTH DAILY 10/08/14   Lucky Cowboy, MD  Melatonin 5 MG TABS Take 10 mg by mouth at bedtime.    Historical Provider, MD  Memantine HCl ER (NAMENDA XR) 21 MG CP24 Take 1 tablet by mouth daily.    Historical Provider, MD  metFORMIN (GLUCOPHAGE) 500 MG tablet Take 500 mg by mouth daily.     Historical Provider, MD  mirtazapine (REMERON) 30 MG tablet Take 30 mg by mouth at bedtime.    Historical Provider, MD  pantoprazole (PROTONIX) 40 MG tablet Take 1 tablet (40 mg total) by mouth daily. 01/16/16    Simbiso Ranga, MD  polyethylene glycol (MIRALAX / GLYCOLAX) packet Take 17 g by mouth daily.    Historical Provider, MD  QUEtiapine (SEROQUEL) 50 MG tablet Take 50 mg by mouth at bedtime.    Historical Provider, MD  rivastigmine (EXELON) 4.6 mg/24hr Place 4.6 mg onto the skin daily.    Historical Provider, MD    Family History Family History  Problem Relation Age of Onset  . Coronary artery disease      No premature  . Cancer Mother 25    breast  . Hypertension Father   . Stroke Father   . Hypertension Brother   . Heart disease Brother   . Cancer Sister     breast  . Dementia Paternal Aunt   . Dementia Paternal Aunt   . Dementia Paternal Aunt     Social History Social History  Substance Use Topics  . Smoking status: Former Smoker    Years: 5.00    Quit date: 10/20/1987  . Smokeless tobacco: Never Used  . Alcohol use No     Allergies   Latex; Lipitor [atorvastatin]; and Shellfish-derived products   Review of Systems Review of Systems  Unable to perform ROS: Dementia     Physical Exam Updated Vital Signs BP 121/83 (BP Location: Left Arm)   Pulse 68   Temp 98.4 F (36.9 C) (Oral)   Resp 18   SpO2 98%   Physical Exam  Constitutional: She appears well-developed and well-nourished.  HENT:  Head: Normocephalic and atraumatic. Head is without raccoon's eyes and without Battle's sign.  Right Ear: Tympanic membrane, external ear and ear canal normal. No hemotympanum.  Left Ear: Tympanic membrane, external ear and ear canal normal. No hemotympanum.  Nose: Nose normal. No nasal septal hematoma.  Mouth/Throat: Uvula is midline, oropharynx is clear and moist and mucous membranes are normal.  Eyes: Conjunctivae, EOM and lids are normal. Pupils are equal, round, and reactive to light. Right eye exhibits no discharge. Left eye exhibits no discharge. Right eye exhibits no nystagmus. Left eye exhibits no nystagmus.  No visible hyphema noted  Neck: Normal range of motion.  Neck supple.  Cardiovascular: Normal rate, regular rhythm and normal heart sounds.   No murmur heard. Pulmonary/Chest: Effort normal and breath sounds normal. No respiratory distress. She has no wheezes. She has no rales.  Abdominal: Soft. There is no tenderness.  Musculoskeletal: She exhibits no tenderness.       Cervical back: She exhibits normal range of motion, no tenderness and no bony tenderness.       Thoracic back: She exhibits no tenderness and no bony tenderness.       Lumbar back: She exhibits no tenderness and no bony tenderness.  There are some minor skin tears  to the lower extremities of various ages. I can passively range all joints of the upper and lower extremities without patient exhibiting any pain. Patient moves her arms freely.  Neurological: She is alert. She has normal strength and normal reflexes. GCS eye subscore is 4. GCS verbal subscore is 5. GCS motor subscore is 6.  Patient moves all extremities purposefully without any guarding.  Skin: Skin is warm and dry.  Psychiatric: She has a normal mood and affect.  Nursing note and vitals reviewed.    ED Treatments / Results   Radiology Dg Forearm Right  Result Date: 01/21/2017 CLINICAL DATA:  76 year old female status post unwitnessed fall with right upper extremity pain. Combative. Initial encounter. EXAM: RIGHT FOREARM - 2 VIEW COMPARISON:  Right humerus series from today reported separately. FINDINGS: Alignment at the right wrist and elbow appears maintained. No elbow joint effusion identified. The radial head appears intact. There is mild chronic and degenerative appearing fragmentation at both humeral epicondyles. Intact right radius and ulna. Degenerative changes at the right thumb CMC joint. IMPRESSION: No acute fracture or dislocation identified about the right forearm. Electronically Signed   By: Odessa FlemingH  Hall M.D.   On: 01/21/2017 14:43   Dg Humerus Right  Result Date: 01/21/2017 CLINICAL DATA:  76 year old female  status post unwitnessed fall with right upper extremity pain. Combative. Initial encounter. EXAM: RIGHT HUMERUS - 2+ VIEW COMPARISON:  None. FINDINGS: Grossly normal alignment at the right shoulder and elbow. Bone mineralization is within normal limits for age. Right humerus is intact. No right elbow joint effusion is evident. Soft tissues about the right humerus appear normal for age. IMPRESSION: No acute fracture or dislocation identified about the right humerus. Electronically Signed   By: Odessa FlemingH  Hall M.D.   On: 01/21/2017 14:42    Procedures Procedures (including critical care time)  Medications Ordered in ED Medications - No data to display   Initial Impression / Assessment and Plan / ED Course  I have reviewed the triage vital signs and the nursing notes.  Pertinent labs & imaging results that were available during my care of the patient were reviewed by me and considered in my medical decision making (see chart for details).     Patient seen and examined. Spoke with witness at [facility] and history as above. Discussed with Dr. Eudelia Bunchardama who will see. Will obtain imaging of the R upper extremity.   Vital signs reviewed and are as follows: BP 121/83 (BP Location: Left Arm)   Pulse 68   Temp 98.4 F (36.9 C) (Oral)   Resp 18   SpO2 98%   2:53 PM Imaging negative.   4:26 PM Dr. Eudelia Bunchardama has seen and spoken with daughter now at bedside.   Additional history obtained -- pt had a fall 2 days ago and struck her head. C/o R hip pain after. Additional imaging ordered.   Handoff to SPX CorporationHarris PA-C at shift change. D/c if imaging neg.     Final Clinical Impressions(s) / ED Diagnoses   Final diagnoses:  Fall, initial encounter  Pain of right upper extremity   Pending work-up.    New Prescriptions New Prescriptions   No medications on file     Renne CriglerJoshua Porshea Janowski, [place] JerseyPA-C 01/21/17 1629

## 2017-01-21 NOTE — ED Provider Notes (Signed)
Witnessed fall on the R side. Awaiting imaging.  Images reviewed and are negative except for concern for potential pelvic and sacral fractures. Patient is able to stand and ambulate without pain. Although her physical examination is complicated by her advanced Alzheimer's dementia. The patient does not have any acute fractures. She is able to ambulate and should be safe for discharge. I discussed the findings with the daughter who agrees with plan of care.   Arthor Captainbigail Margarett Viti, PA-C 01/22/17 2328    Courteney Randall AnLyn Mackuen, MD 01/22/17 [phone]

## 2017-01-21 NOTE — ED Notes (Signed)
Bed: [id number] Expected date:  Expected time:  Means of arrival:  Comments: 76 yo f unwitnessed fall

## 2017-01-22 NOTE — ED Provider Notes (Signed)
Medical screening examination/treatment/procedure(s) were conducted as a shared visit with non-physician practitioner(s) and myself.  I personally evaluated the patient during the encounter. Briefly, the patient is a 76 y.o. female with dementia present after fall at [facility] initially complaining of right arm pain. Spoke with daughter who mentioned that the patient had fallen 2 days ago and complained of right hip pain and had head trauma at that time; was not seen medically at that time. Possible sacral fracture on plain film, but patient able to stand and ambulate w/o complaint or complication. CT head negative. WAT. The patient is safe for discharge with strict return precautions.    EKG Interpretation None         Nira ConnPedro Eduardo Cardama, MD 01/22/17 [phone]

## 2017-02-05 ENCOUNTER — Encounter (HOSPITAL_COMMUNITY): Payer: Self-pay | Admitting: Emergency Medicine

## 2017-02-05 ENCOUNTER — Emergency Department (HOSPITAL_COMMUNITY): Payer: Medicare Other

## 2017-02-05 ENCOUNTER — Emergency Department (HOSPITAL_COMMUNITY)
Admission: EM | Admit: 2017-02-05 | Discharge: 2017-02-05 | Disposition: A | Discharge status: Home / Self Care | Payer: Medicare Other | Attending: Emergency Medicine | Admitting: Emergency Medicine

## 2017-02-05 DIAGNOSIS — Z87891 Personal history of nicotine dependence: Secondary | ICD-10-CM | POA: Insufficient documentation

## 2017-02-05 DIAGNOSIS — N3000 Acute cystitis without hematuria: Secondary | ICD-10-CM | POA: Insufficient documentation

## 2017-02-05 DIAGNOSIS — S0990XA Unspecified injury of head, initial encounter: Secondary | ICD-10-CM

## 2017-02-05 DIAGNOSIS — Y999 Unspecified external cause status: Secondary | ICD-10-CM | POA: Insufficient documentation

## 2017-02-05 DIAGNOSIS — S199XXA Unspecified injury of neck, initial encounter: Secondary | ICD-10-CM | POA: Diagnosis present

## 2017-02-05 DIAGNOSIS — W19XXXA Unspecified fall, initial encounter: Secondary | ICD-10-CM | POA: Diagnosis not present

## 2017-02-05 DIAGNOSIS — Y939 Activity, unspecified: Secondary | ICD-10-CM | POA: Insufficient documentation

## 2017-02-05 DIAGNOSIS — G309 Alzheimer's disease, unspecified: Secondary | ICD-10-CM | POA: Insufficient documentation

## 2017-02-05 DIAGNOSIS — E119 Type 2 diabetes mellitus without complications: Secondary | ICD-10-CM | POA: Insufficient documentation

## 2017-02-05 DIAGNOSIS — Z91040 Latex allergy status: Secondary | ICD-10-CM | POA: Insufficient documentation

## 2017-02-05 DIAGNOSIS — S161XXA Strain of muscle, fascia and tendon at neck level, initial encounter: Secondary | ICD-10-CM | POA: Insufficient documentation

## 2017-02-05 DIAGNOSIS — I10 Essential (primary) hypertension: Secondary | ICD-10-CM | POA: Diagnosis not present

## 2017-02-05 DIAGNOSIS — Y92129 Unspecified place in nursing home as the place of occurrence of the external cause: Secondary | ICD-10-CM | POA: Insufficient documentation

## 2017-02-05 LAB — CBC WITH DIFFERENTIAL/PLATELET
BASOS ABS: 0.1 10*3/uL (ref 0.0–0.1)
Basophils Relative: 1 %
EOS ABS: 0.4 10*3/uL (ref 0.0–0.7)
EOS PCT: 5 %
HCT: 37.5 % (ref 36.0–46.0)
HEMOGLOBIN: 12.7 g/dL (ref 12.0–15.0)
LYMPHS ABS: 1.6 10*3/uL (ref 0.7–4.0)
LYMPHS PCT: 22 %
MCH: 30.4 pg (ref 26.0–34.0)
MCHC: 33.9 g/dL (ref 30.0–36.0)
MCV: 89.7 fL (ref 78.0–100.0)
Monocytes Absolute: 0.6 10*3/uL (ref 0.1–1.0)
Monocytes Relative: 8 %
NEUTROS PCT: 64 %
Neutro Abs: 4.7 10*3/uL (ref 1.7–7.7)
PLATELETS: 352 10*3/uL (ref 150–400)
RBC: 4.18 MIL/uL (ref 3.87–5.11)
RDW: 12.6 % (ref 11.5–15.5)
WBC: 7.4 10*3/uL (ref 4.0–10.5)

## 2017-02-05 LAB — COMPREHENSIVE METABOLIC PANEL
ALT: 28 U/L (ref 14–54)
AST: 25 U/L (ref 15–41)
Albumin: 3.7 g/dL (ref 3.5–5.0)
Alkaline Phosphatase: 130 U/L — ABNORMAL HIGH (ref 38–126)
Anion gap: 6 (ref 5–15)
BILIRUBIN TOTAL: 0.8 mg/dL (ref 0.3–1.2)
BUN: 11 mg/dL (ref 6–20)
CHLORIDE: 104 mmol/L (ref 101–111)
CO2: 28 mmol/L (ref 22–32)
Calcium: 8.9 mg/dL (ref 8.9–10.3)
Creatinine, Ser: 0.73 mg/dL (ref 0.44–1.00)
Glucose, Bld: 114 mg/dL — ABNORMAL HIGH (ref 65–99)
POTASSIUM: 3.5 mmol/L (ref 3.5–5.1)
Sodium: 138 mmol/L (ref 135–145)
TOTAL PROTEIN: 6.9 g/dL (ref 6.5–8.1)

## 2017-02-05 LAB — URINALYSIS, ROUTINE W REFLEX MICROSCOPIC
BILIRUBIN URINE: NEGATIVE
GLUCOSE, UA: NEGATIVE mg/dL
Hgb urine dipstick: NEGATIVE
KETONES UR: NEGATIVE mg/dL
Nitrite: POSITIVE — AB
PH: 7 (ref 5.0–8.0)
PROTEIN: NEGATIVE mg/dL
Specific Gravity, Urine: 1.017 (ref 1.005–1.030)

## 2017-02-05 LAB — I-STAT TROPONIN, ED: TROPONIN I, POC: 0 ng/mL (ref 0.00–0.08)

## 2017-02-05 MED ORDER — ALPRAZOLAM 0.25 MG PO TABS
0.2500 mg | ORAL_TABLET | Freq: Once | ORAL | Status: AC
  Administered 2017-02-05: 0.25 mg via ORAL
  Filled 2017-02-05: qty 1

## 2017-02-05 MED ORDER — CEFTRIAXONE SODIUM 1 G IJ SOLR
1.0000 g | Freq: Once | INTRAMUSCULAR | Status: AC
  Administered 2017-02-05: 1 g via INTRAVENOUS
  Filled 2017-02-05: qty 10

## 2017-02-05 MED ORDER — CEPHALEXIN 500 MG PO CAPS: 500.0000 mg | ORAL_CAPSULE | Freq: Two times a day (BID) | ORAL | 0 refills | Status: AC

## 2017-02-05 NOTE — ED Triage Notes (Signed)
Per EMS, patient from [facility], staff reports patient had unwitnessed fall this morning. Staff reports patient had bloody nose. Bleeding controlled at this time. Staff also reports patient has had diarrhea x2 days. Ambulatory with EMS. Hx dementia. A&Ox1 at baseline. Combative with EMS.

## 2017-02-05 NOTE — Discharge Instructions (Signed)

## 2017-02-05 NOTE — ED Notes (Signed)
Patient transported to CT 

## 2017-02-05 NOTE — ED Notes (Signed)
RN is starting IV and collecting blood culture.
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
## 2017-02-05 NOTE — ED Notes (Signed)
Patient given sandwich and juice

## 2017-02-05 NOTE — ED Notes (Signed)
Bed: WA20 Expected date:  Expected time:  Means of arrival:  Comments: EMS 76 yo, fall

## 2017-02-05 NOTE — ED Notes (Signed)
Patient's daughter at bedside.

## 2017-02-05 NOTE — ED Provider Notes (Signed)
Emergency Department Provider Note   I have reviewed the triage vital signs and the nursing notes.   HISTORY  Chief Complaint Fall   HPI Amy [name] is a 76 y.o. female with PMH of Alzheimer's disease, HTN, DM, and HLD presents to the emergency department for evaluation of unwitnessed fall at the nursing home the setting of diarrhea for the past 2 days. Per EMS the patient was found down on the floor. No clear reason for the fall. They report poor oral intake and diarrhea over the past 2 days.   Level 5 caveat: Dementia.    Past Medical History:  Diagnosis Date  . Alzheimer's disease 02/22/2015  . Anxiety   . Chest pain    adenosine myoview (10/10) showed EF 72% and ischemia in the apical anterior wall and true apex. LHC (11/10): EF 60%, LVEDP 11, separate ostia for LAD and CFX, mild luminal irregularities in the LAD, no obstructive disease  . Dementia of the Alzheimer's type   . Depression   . HTN (hypertension)   . Hypercholesterolemia with hyperglyceridemia   . Type 2 diabetes mellitus [facility])     Patient Active Problem List   Diagnosis Date Noted  . Acute calculous cholecystitis 01/19/2016  . Acute gallstone pancreatitis 01/19/2016  . Mental status change 01/19/2016  . GERD (gastroesophageal reflux disease) 01/19/2016  . Anxiety 01/19/2016  . Alzheimer's disease 02/22/2015  . Encounter for long-term (current) use of other medications 03/16/2014  . SDAT 03/16/2014  . Vitamin D deficiency 03/16/2014  . Diabetes mellitus type 2, controlled (HCC)   . HTN (hypertension)   . Hyperlipidemia 09/29/2009    Past Surgical History:  Procedure Laterality Date  . CARDIAC CATHETERIZATION  2010   negative  . CATARACT EXTRACTION Bilateral   . CHOLECYSTECTOMY N/A 01/12/2016   Procedure: LAPAROSCOPIC CHOLECYSTECTOMY WITH INTRAOPERATIVE CHOLANGIOGRAM;  Surgeon: De Blanch Kinsinger, MD;  Location: [facility];  Service: General;  Laterality: N/A;  . COLONOSCOPY     07/02/2010    . TUBAL LIGATION      Current Outpatient Rx  . Order #: [id number] Class: Historical Med  . Order #: [id number] Class: Historical Med  . Order #: [id number] Class: Historical Med  . Order #: [id number] Class: Historical Med  . Order #: [id number] Class: Normal  . Order #: [id number] Class: Historical Med  . Order #: [id number] Class: Historical Med  . Order #: [id number] Class: Historical Med  . Order #: [id number] Class: Normal  . Order #: [id number] Class: Historical Med  . Order #: [id number] Class: Historical Med  . Order #: [id number] Class: Historical Med  . Order #: [id number] Class: Historical Med  . Order #: [id number] Class: Historical Med  . Order #: [id number] Class: Historical Med  . Order #: [id number] Class: Historical Med  . Order #: [id number] Class: Historical Med  . Order #: [id number] Class: Historical Med  . Order #: [id number] Class: Print  . Order #: [id number] Class: Print  . Order #: [id number] Class: Normal  . Order #: [id number] Class: Print    Allergies Latex; Lipitor [atorvastatin]; and Shellfish-derived products  Family History  Problem Relation Age of Onset  . Cancer Mother 1    breast  . Hypertension Father   . Stroke Father   . Hypertension Brother   . Heart disease Brother   . Cancer Sister     breast  . Dementia Paternal Aunt   . Dementia Paternal Aunt   . Dementia Paternal Aunt   . Coronary artery disease      No premature    Social History Social History  Substance Use Topics  . Smoking status: Former Smoker    Years: 5.00    Quit date: 10/20/1987  . Smokeless tobacco: Never Used  . Alcohol use No    Review of Systems  Level 5 caveat: Dementia.   ____________________________________________   PHYSICAL EXAM:  VITAL SIGNS: ED Triage Vitals [02/05/17 1123]  Enc Vitals Group     BP 143/70     Pulse Rate 73     Resp 18     Temp 98.1 F (36.7 C)     Temp Source Axillary     SpO2 99 %    Constitutional: Alert but agitated and combative at times with  staff. Confusion noted.  Eyes: Conjunctivae are normal. PERRL.  Head: Atraumatic. Nose: No congestion/rhinnorhea. Mouth/Throat: Mucous membranes are moist.   Neck: No stridor.   Cardiovascular: Normal rate, regular rhythm. Good peripheral circulation. Grossly normal heart sounds.   Respiratory: Normal respiratory effort.  No retractions. Lungs CTAB. Gastrointestinal: Soft and nontender. No distention.  Musculoskeletal: No lower extremity tenderness nor edema. No gross deformities of extremities. Neurologic:  No gross focal neurologic deficits are appreciated.  Skin:  Skin is warm, dry and intact. No rash noted.   ____________________________________________   LABS (all labs ordered are listed, but only abnormal results are displayed)  Labs Reviewed  COMPREHENSIVE METABOLIC PANEL - Abnormal; Notable for the following:       Result Value   Glucose, Bld 114 (*)    Alkaline Phosphatase 130 (*)    All other components within normal limits  URINALYSIS, ROUTINE W REFLEX MICROSCOPIC - Abnormal; Notable for the following:    APPearance CLOUDY (*)    Nitrite POSITIVE (*)    Leukocytes, UA LARGE (*)    Bacteria, UA MANY (*)    Squamous Epithelial / LPF 0-5 (*)    All other components within normal limits  CULTURE, BLOOD (SINGLE)  CBC WITH DIFFERENTIAL/PLATELET  I-STAT TROPOININ, ED   ____________________________________________  EKG   EKG Interpretation  Date/Time:  Wednesday February 05 2017 12:33:16 EDT Ventricular Rate:  73 PR Interval:    QRS Duration: 92 QT Interval:  417 QTC Calculation: 460 R Axis:   25 Text Interpretation:  Sinus rhythm Baseline wander in lead(s) V1 No STEMI.  Confirmed by LONG MD, JOSHUA [phone]) on 02/05/2017 1:27:56 PM       ____________________________________________  RADIOLOGY  Ct Head Wo Contrast  Result Date: 02/05/2017 CLINICAL DATA:  Status post fall today. Nosebleed. Initial encounter. EXAM: CT HEAD WITHOUT CONTRAST CT CERVICAL SPINE  WITHOUT CONTRAST TECHNIQUE: Multidetector CT imaging of the head and cervical spine was performed following the standard protocol without intravenous contrast. Multiplanar CT image reconstructions of the cervical spine were also generated. COMPARISON:  Head CT scan 01/21/2017. Head and maxillofacial CT scan 05/13/2016. FINDINGS: CT HEAD FINDINGS Brain: There is cortical atrophy and some chronic microvascular ischemic change. No evidence of acute intracranial abnormality including hemorrhage, infarct, mass lesion, mass effect, midline shift or abnormal extra-axial fluid collection. No hydrocephalus or pneumocephalus. Vascular: Atherosclerotic vascular disease noted. Skull: Intact. Sinuses/Orbits: No acute abnormality. Status post bilateral lens extraction. Other: None. CT CERVICAL SPINE FINDINGS Alignment: The study is again degraded by patient motion despite repeat scanning. Reversal of the normal cervical lordosis is seen. Skull base and vertebrae: No acute fracture. No primary bone lesion or focal pathologic process. Soft tissues and spinal canal: No prevertebral fluid or swelling. No visible canal hematoma. Disc levels: Loss of disc space height and endplate spurring  appear worst at C4-5, C5-6 and C6-7. Upper chest: Lung apices are clear. Other: None. IMPRESSION: No acute intracranial abnormality. Atrophy and chronic microvascular ischemic change. Motion degraded cervical spine CT scan. No abnormality is seen. The patient was scanned twice. Atherosclerosis. Cervical spondylosis. Electronically Signed   By: Drusilla Kanner M.D.   On: 02/05/2017 14:28   Ct Cervical Spine Wo Contrast  Result Date: 02/05/2017 CLINICAL DATA:  Status post fall today. Nosebleed. Initial encounter. EXAM: CT HEAD WITHOUT CONTRAST CT CERVICAL SPINE WITHOUT CONTRAST TECHNIQUE: Multidetector CT imaging of the head and cervical spine was performed following the standard protocol without intravenous contrast. Multiplanar CT image  reconstructions of the cervical spine were also generated. COMPARISON:  Head CT scan 01/21/2017. Head and maxillofacial CT scan 05/13/2016. FINDINGS: CT HEAD FINDINGS Brain: There is cortical atrophy and some chronic microvascular ischemic change. No evidence of acute intracranial abnormality including hemorrhage, infarct, mass lesion, mass effect, midline shift or abnormal extra-axial fluid collection. No hydrocephalus or pneumocephalus. Vascular: Atherosclerotic vascular disease noted. Skull: Intact. Sinuses/Orbits: No acute abnormality. Status post bilateral lens extraction. Other: None. CT CERVICAL SPINE FINDINGS Alignment: The study is again degraded by patient motion despite repeat scanning. Reversal of the normal cervical lordosis is seen. Skull base and vertebrae: No acute fracture. No primary bone lesion or focal pathologic process. Soft tissues and spinal canal: No prevertebral fluid or swelling. No visible canal hematoma. Disc levels: Loss of disc space height and endplate spurring appear worst at C4-5, C5-6 and C6-7. Upper chest: Lung apices are clear. Other: None. IMPRESSION: No acute intracranial abnormality. Atrophy and chronic microvascular ischemic change. Motion degraded cervical spine CT scan. No abnormality is seen. The patient was scanned twice. Atherosclerosis. Cervical spondylosis. Electronically Signed   By: Drusilla Kanner M.D.   On: 02/05/2017 14:28    ____________________________________________   PROCEDURES  Procedure(s) performed:   Procedures  None ____________________________________________   INITIAL IMPRESSION / ASSESSMENT AND PLAN / ED COURSE  Pertinent labs & imaging results that were available during my care of the patient were reviewed by me and considered in my medical decision making (see chart for details).  Patient resents emergency department for evaluation of unwitnessed fall and poor oral intake with diarrhea for the past 2 days. Patient is afebrile  and hemodynamically stable. She has baseline confusion. Plan for CT of the head and neck. She has full range of motion of her bilateral hips, knees, ankles. No upper extremity injuries obvious. With unwitnessed fall plan for blood work and urinalysis with 2 days of diarrhea.  02:45 PM Had a long conversation with the patient's daughter who is now bedside. She states that the patient's mental status is close to her baseline. She does seem slightly more agitated since transport in the ambulance and arrival to the emergency department. She does have evidence of a urinary tract infection. She has no fever, is HDS, and has no other acute findings on labs and CT scans. Given the patient's situation with severe dementia I discussed multiple options. I think that giving the patient Rocephin and discharging to her memory care unit for oral antibiotics is the best choice. I feel that admission to the hospital this time may cause undue stress to the patient and she has no other clinical signs or symptoms to suggest severe systemic infection. She is tolerating PO. Daughter agrees with plan. Will give Rocephin and discharge.   At this time, I do not feel there is any life-threatening condition present. I have  reviewed and discussed all results (EKG, imaging, lab, urine as appropriate), exam findings with patient. I have reviewed nursing notes and appropriate previous records.  I feel the patient is safe to be discharged home without further emergent workup. Discussed usual and customary return precautions. Patient and family (if present) verbalize understanding and are comfortable with this plan.  Patient will follow-up with their primary care provider. If they do not have a primary care provider, information for follow-up has been provided to them. All questions have been answered.  ____________________________________________  FINAL CLINICAL IMPRESSION(S) / ED DIAGNOSES  Final diagnoses:  Acute cystitis without  hematuria  Fall, initial encounter  Injury of head, initial encounter  Strain of neck muscle, initial encounter     MEDICATIONS GIVEN DURING THIS VISIT:  Medications  cefTRIAXone (ROCEPHIN) 1 g in dextrose 5 % 50 mL IVPB (0 g Intravenous Stopped 02/05/17 1643)  ALPRAZolam (XANAX) tablet 0.25 mg (0.25 mg Oral Given 02/05/17 1453)     NEW OUTPATIENT MEDICATIONS STARTED DURING THIS VISIT:  Discharge Medication List as of 02/05/2017  3:53 PM    START taking these medications   Details  cephALEXin (KEFLEX) 500 MG capsule Take 1 capsule (500 mg total) by mouth 2 (two) times daily., Starting Wed 02/05/2017, Until Wed 02/12/2017, Print        Note:  This document was prepared using Dragon voice recognition software and may include unintentional dictation errors.  Alona Bene, MD Emergency Medicine   Maia Plan, MD 02/05/17 [phone]

## 2017-02-10 LAB — CULTURE, BLOOD (SINGLE): Culture: NO GROWTH

## 2017-02-21 ENCOUNTER — Emergency Department (HOSPITAL_COMMUNITY): Payer: Medicare Other

## 2017-02-21 ENCOUNTER — Encounter (HOSPITAL_COMMUNITY): Payer: Self-pay | Admitting: Emergency Medicine

## 2017-02-21 ENCOUNTER — Emergency Department (HOSPITAL_COMMUNITY)
Admission: EM | Admit: 2017-02-21 | Discharge: 2017-02-22 | Disposition: A | Discharge status: Home / Self Care | Payer: Medicare Other | Attending: Emergency Medicine | Admitting: Emergency Medicine

## 2017-02-21 DIAGNOSIS — S0990XA Unspecified injury of head, initial encounter: Secondary | ICD-10-CM

## 2017-02-21 DIAGNOSIS — Z91040 Latex allergy status: Secondary | ICD-10-CM | POA: Insufficient documentation

## 2017-02-21 DIAGNOSIS — Y939 Activity, unspecified: Secondary | ICD-10-CM | POA: Diagnosis not present

## 2017-02-21 DIAGNOSIS — W1830XA Fall on same level, unspecified, initial encounter: Secondary | ICD-10-CM | POA: Insufficient documentation

## 2017-02-21 DIAGNOSIS — Z7982 Long term (current) use of aspirin: Secondary | ICD-10-CM | POA: Insufficient documentation

## 2017-02-21 DIAGNOSIS — E119 Type 2 diabetes mellitus without complications: Secondary | ICD-10-CM | POA: Diagnosis not present

## 2017-02-21 DIAGNOSIS — Y999 Unspecified external cause status: Secondary | ICD-10-CM | POA: Diagnosis not present

## 2017-02-21 DIAGNOSIS — Z87891 Personal history of nicotine dependence: Secondary | ICD-10-CM | POA: Diagnosis not present

## 2017-02-21 DIAGNOSIS — G309 Alzheimer's disease, unspecified: Secondary | ICD-10-CM | POA: Diagnosis not present

## 2017-02-21 DIAGNOSIS — I10 Essential (primary) hypertension: Secondary | ICD-10-CM | POA: Insufficient documentation

## 2017-02-21 DIAGNOSIS — Y92002 Bathroom of unspecified non-institutional (private) residence single-family (private) house as the place of occurrence of the external cause: Secondary | ICD-10-CM | POA: Diagnosis not present

## 2017-02-21 DIAGNOSIS — S0101XA Laceration without foreign body of scalp, initial encounter: Secondary | ICD-10-CM | POA: Diagnosis not present

## 2017-02-21 DIAGNOSIS — Z79899 Other long term (current) drug therapy: Secondary | ICD-10-CM | POA: Insufficient documentation

## 2017-02-21 LAB — BASIC METABOLIC PANEL
ANION GAP: 7 (ref 5–15)
BUN: 11 mg/dL (ref 6–20)
CALCIUM: 9 mg/dL (ref 8.9–10.3)
CHLORIDE: 103 mmol/L (ref 101–111)
CO2: 27 mmol/L (ref 22–32)
CREATININE: 0.6 mg/dL (ref 0.44–1.00)
GFR calc non Af Amer: >60 mL/min (ref >60)
Glucose, Bld: 111 mg/dL — ABNORMAL HIGH (ref 65–99)
Potassium: 3.6 mmol/L (ref 3.5–5.1)
SODIUM: 137 mmol/L (ref 135–145)

## 2017-02-21 LAB — CBC WITH DIFFERENTIAL/PLATELET
BASOS ABS: 0 10*3/uL (ref 0.0–0.1)
BASOS PCT: 1 %
EOS ABS: 0.4 10*3/uL (ref 0.0–0.7)
Eosinophils Relative: 6 %
HEMATOCRIT: 37.8 % (ref 36.0–46.0)
HEMOGLOBIN: 12.6 g/dL (ref 12.0–15.0)
Lymphocytes Relative: 28 %
Lymphs Abs: 1.9 10*3/uL (ref 0.7–4.0)
MCH: 31 pg (ref 26.0–34.0)
MCHC: 33.3 g/dL (ref 30.0–36.0)
MCV: 92.9 fL (ref 78.0–100.0)
Monocytes Absolute: 0.7 10*3/uL (ref 0.1–1.0)
Monocytes Relative: 10 %
NEUTROS ABS: 3.7 10*3/uL (ref 1.7–7.7)
NEUTROS PCT: 55 %
Platelets: 296 10*3/uL (ref 150–400)
RBC: 4.07 MIL/uL (ref 3.87–5.11)
RDW: 13.2 % (ref 11.5–15.5)
WBC: 6.7 10*3/uL (ref 4.0–10.5)

## 2017-02-21 LAB — I-STAT CG4 LACTIC ACID, ED: LACTIC ACID, VENOUS: 1.83 mmol/L (ref 0.5–1.9)

## 2017-02-21 LAB — TSH: TSH: 3.678 u[IU]/mL (ref 0.350–4.500)

## 2017-02-21 MED ORDER — LIDOCAINE-EPINEPHRINE (PF) 2 %-1:200000 IJ SOLN
20.0000 mL | Freq: Once | INTRAMUSCULAR | Status: DC
  Filled 2017-02-21: qty 20

## 2017-02-21 NOTE — ED Provider Notes (Signed)
[facility]-EMERGENCY DEPT Provider Note   CSN: [id number] Arrival date & time: 02/21/17  2004     History   Chief Complaint Chief Complaint  Patient presents with  . Fall  . Head Injury    HPI Amy [name] is a 76 y.o. female.  Patient with history of Alzheimer's dementia, hypertension, diabetes -- presents after a fall. No anticoagulation. Patient's daughter was with her up until 6:00. Patient fell sometime around 7 PM. Fall was unwitnessed and patient was found awake on the bathroom floor. She had a laceration to the posterior scalp. Per EMS report, patient received Haldol and Versed prior to arrival. Level V caveat due to dementia.      Past Medical History:  Diagnosis Date  . Alzheimer's disease 02/22/2015  . Anxiety   . Chest pain    adenosine myoview (10/10) showed EF 72% and ischemia in the apical anterior wall and true apex. LHC (11/10): EF 60%, LVEDP 11, separate ostia for LAD and CFX, mild luminal irregularities in the LAD, no obstructive disease  . Dementia of the Alzheimer's type   . Depression   . HTN (hypertension)   . Hypercholesterolemia with hyperglyceridemia   . Type 2 diabetes mellitus [facility])     Patient Active Problem List   Diagnosis Date Noted  . Acute calculous cholecystitis 01/19/2016  . Acute gallstone pancreatitis 01/19/2016  . Mental status change 01/19/2016  . GERD (gastroesophageal reflux disease) 01/19/2016  . Anxiety 01/19/2016  . Alzheimer's disease 02/22/2015  . Encounter for long-term (current) use of other medications 03/16/2014  . SDAT 03/16/2014  . Vitamin D deficiency 03/16/2014  . Diabetes mellitus type 2, controlled (HCC)   . HTN (hypertension)   . Hyperlipidemia 09/29/2009    Past Surgical History:  Procedure Laterality Date  . CARDIAC CATHETERIZATION  2010   negative  . CATARACT EXTRACTION Bilateral   . CHOLECYSTECTOMY N/A 01/12/2016   Procedure: LAPAROSCOPIC CHOLECYSTECTOMY WITH INTRAOPERATIVE CHOLANGIOGRAM;  Surgeon:  De Blanch Kinsinger, MD;  Location: [facility] ORS;  Service: General;  Laterality: N/A;  . COLONOSCOPY     07/02/2010  . TUBAL LIGATION      OB History    No data available       Home Medications    Prior to Admission medications   Medication Sig Start Date End Date Taking? Authorizing Provider  acetaminophen (TYLENOL) 325 MG tablet Take 650 mg by mouth every 4 (four) hours as needed for moderate pain.    Historical Provider, MD  ALPRAZolam Prudy Feeler) 0.25 MG tablet Take 1 tablet (0.25 mg total) by mouth every 4 (four) hours as needed for anxiety. Patient not taking: Reported on 02/05/2017 01/16/16   Conley Canal, MD  ALPRAZolam Prudy Feeler) 0.5 MG tablet Take 0.5 mg by mouth every 6 (six) hours as needed (for behaviors).    Historical Provider, MD  ALPRAZolam Prudy Feeler) 0.5 MG tablet Take 0.5 mg by mouth 3 (three) times daily.    Historical Provider, MD  aspirin 81 MG chewable tablet Chew 81 mg by mouth daily.    Historical Provider, MD  BAYER CONTOUR TEST test strip USE TO CHECK GLUCOSE EVERY DAY 08/23/14   Lucky Cowboy, MD  cetirizine (ZYRTEC) 10 MG tablet Take 10 mg by mouth daily as needed for allergies.    Historical Provider, MD  cholecalciferol (VITAMIN D) 1000 UNITS tablet Take 1,000 Units by mouth daily.    Historical Provider, MD  DULoxetine (CYMBALTA) 30 MG capsule Take 30 mg by mouth daily with breakfast.  Historical Provider, MD  enalapril (VASOTEC) 20 MG tablet TAKE 1 TABLET BY MOUTH EVERY MORNING 09/07/14   Lucky Cowboy, MD  fenofibrate micronized (LOFIBRA) 134 MG capsule TAKE ONE CAPSULE BY MOUTH DAILY Patient not taking: Reported on 02/05/2017 10/08/14   Lucky Cowboy, MD  guaiFENesin-dextromethorphan (ROBITUSSIN DM) 100-10 MG/5ML syrup Take 10 mLs by mouth every 4 (four) hours as needed for cough.    Historical Provider, MD  loperamide (IMODIUM A-D) 2 MG tablet Take 2 mg by mouth every 6 (six) hours as needed for diarrhea or loose stools.    Historical Provider, MD  Melatonin  5 MG TABS Take 10 mg by mouth at bedtime.    Historical Provider, MD  Memantine HCl ER (NAMENDA XR) 21 MG CP24 Take 1 tablet by mouth daily.    Historical Provider, MD  metFORMIN (GLUCOPHAGE) 500 MG tablet Take 500 mg by mouth daily.     Historical Provider, MD  pantoprazole (PROTONIX) 40 MG tablet Take 1 tablet (40 mg total) by mouth daily. Patient not taking: Reported on 02/05/2017 01/16/16   Conley Canal, MD  polyethylene glycol (MIRALAX / GLYCOLAX) packet Take 17 g by mouth daily as needed for mild constipation.     Historical Provider, MD  QUEtiapine (SEROQUEL) 100 MG tablet Take 100 mg by mouth at bedtime.    Historical Provider, MD  QUEtiapine (SEROQUEL) 25 MG tablet Take 25 mg by mouth daily.    Historical Provider, MD  rivastigmine (EXELON) 4.6 mg/24hr Place 4.6 mg onto the skin daily.    Historical Provider, MD    Family History Family History  Problem Relation Age of Onset  . Cancer Mother 54    breast  . Hypertension Father   . Stroke Father   . Hypertension Brother   . Heart disease Brother   . Cancer Sister     breast  . Dementia Paternal Aunt   . Dementia Paternal Aunt   . Dementia Paternal Aunt   . Coronary artery disease      No premature    Social History Social History  Substance Use Topics  . Smoking status: Former Smoker    Years: 5.00    Quit date: 10/20/1987  . Smokeless tobacco: Never Used  . Alcohol use No     Allergies   Latex; Lipitor [atorvastatin]; and Shellfish-derived products   Review of Systems Review of Systems  Unable to perform ROS: Dementia     Physical Exam Updated Vital Signs BP (!) 124/53 (BP Location: Right Arm)   Pulse 85   Temp 97 F (36.1 C) (Axillary)   Resp 18   SpO2 96%   Physical Exam  Constitutional: She appears well-developed and well-nourished.  HENT:  Head: Normocephalic. Head is without raccoon's eyes and without Battle's sign.  Right Ear: Tympanic membrane, external ear and ear canal normal. No  hemotympanum.  Left Ear: Tympanic membrane, external ear and ear canal normal. No hemotympanum.  Nose: Nose normal. No nasal septal hematoma.  Mouth/Throat: Uvula is midline, oropharynx is clear and moist and mucous membranes are normal.  Posterior scalp laceration, 1cm, linear. Dried blood noted matted in hair.   Eyes: Conjunctivae, EOM and lids are normal. Pupils are equal, round, and reactive to light. Right eye exhibits no nystagmus. Left eye exhibits no nystagmus.  No visible hyphema noted  Neck: Normal range of motion. Neck supple.  Cardiovascular: Normal rate and regular rhythm.  Exam reveals no friction rub.   No murmur heard. Pulmonary/Chest: Effort normal  and breath sounds normal.  Abdominal: Soft. There is no tenderness.  Musculoskeletal:       Cervical back: She exhibits normal range of motion, no tenderness and no bony tenderness.       Thoracic back: She exhibits no tenderness and no bony tenderness.       Lumbar back: She exhibits no tenderness and no bony tenderness.  Patient with full range of motion of extremities including hips and shoulders. No apparent pain with ranging. No obvious deformities.  Neurological: She is alert. She has normal strength and normal reflexes. GCS eye subscore is 4. GCS verbal subscore is 5. GCS motor subscore is 6.  Patient with baseline dementia. She moves all extremities purposefully without apparent injury.  Skin: Skin is warm and dry.  Psychiatric: She has a normal mood and affect.  Nursing note and vitals reviewed.    ED Treatments / Results  Labs (all labs ordered are listed, but only abnormal results are displayed) Labs Reviewed  BASIC METABOLIC PANEL - Abnormal; Notable for the following:       Result Value   Glucose, Bld 111 (*)    All other components within normal limits  CBC WITH DIFFERENTIAL/PLATELET  TSH  I-STAT CG4 LACTIC ACID, ED    Radiology Ct Head Wo Contrast  Result Date: 02/21/2017 CLINICAL DATA:  Status post  fall in bathroom, with injury at the back of the head. Initial encounter. EXAM: CT HEAD WITHOUT CONTRAST CT CERVICAL SPINE WITHOUT CONTRAST TECHNIQUE: Multidetector CT imaging of the head and cervical spine was performed following the standard protocol without intravenous contrast. Multiplanar CT image reconstructions of the cervical spine were also generated. COMPARISON:  CT of the head and cervical spine performed 02/05/2017 FINDINGS: CT HEAD FINDINGS Brain: No evidence of acute infarction, hemorrhage, hydrocephalus, extra-axial collection or mass lesion/mass effect. Prominence of the ventricles and sulci reflects moderate cortical volume loss. Mild cerebellar atrophy is noted. Mild periventricular white matter change likely reflects small vessel ischemic microangiopathy. The brainstem and fourth ventricle are within normal limits. The basal ganglia are unremarkable in appearance. The cerebral hemispheres demonstrate grossly normal gray-white differentiation. No mass effect or midline shift is seen. Vascular: No hyperdense vessel or unexpected calcification. Skull: There is no evidence of fracture; visualized osseous structures are unremarkable in appearance. Sinuses/Orbits: The visualized portions of the orbits are within normal limits. The paranasal sinuses and mastoid air cells are well-aerated. Other: Soft tissue swelling is noted overlying the right posterior parietal calvarium. CT CERVICAL SPINE FINDINGS Alignment: Normal. Skull base and vertebrae: No acute fracture. No primary bone lesion or focal pathologic process. Soft tissues and spinal canal: No prevertebral fluid or swelling. No visible canal hematoma. Disc levels: Multilevel disc space narrowing is noted along the lower cervical spine, with scattered anterior and posterior disc osteophyte complexes. Upper chest: The thyroid gland is unremarkable in appearance. Scattered calcification is seen at the carotid bifurcations bilaterally, more prominent on  the right. The visualized lung apices are clear. Other: No additional soft tissue abnormalities are seen. IMPRESSION: 1. No evidence of traumatic intracranial injury or fracture. 2. No evidence of fracture or subluxation along the cervical spine. 3. Soft tissue swelling overlying the right posterior parietal calvarium. 4. Moderate cortical volume loss and scattered small vessel ischemic microangiopathy. 5. Mild degenerative change along the cervical spine. 6. Scattered calcification at the carotid bifurcations bilaterally, more prominent on the right. Carotid ultrasound could be considered for further evaluation, when and as deemed clinically appropriate. Electronically Signed  By: Roanna Raider M.D.   On: 02/21/2017 22:12   Ct Cervical Spine Wo Contrast  Result Date: 02/21/2017 CLINICAL DATA:  Status post fall in bathroom, with injury at the back of the head. Initial encounter. EXAM: CT HEAD WITHOUT CONTRAST CT CERVICAL SPINE WITHOUT CONTRAST TECHNIQUE: Multidetector CT imaging of the head and cervical spine was performed following the standard protocol without intravenous contrast. Multiplanar CT image reconstructions of the cervical spine were also generated. COMPARISON:  CT of the head and cervical spine performed 02/05/2017 FINDINGS: CT HEAD FINDINGS Brain: No evidence of acute infarction, hemorrhage, hydrocephalus, extra-axial collection or mass lesion/mass effect. Prominence of the ventricles and sulci reflects moderate cortical volume loss. Mild cerebellar atrophy is noted. Mild periventricular white matter change likely reflects small vessel ischemic microangiopathy. The brainstem and fourth ventricle are within normal limits. The basal ganglia are unremarkable in appearance. The cerebral hemispheres demonstrate grossly normal gray-white differentiation. No mass effect or midline shift is seen. Vascular: No hyperdense vessel or unexpected calcification. Skull: There is no evidence of fracture;  visualized osseous structures are unremarkable in appearance. Sinuses/Orbits: The visualized portions of the orbits are within normal limits. The paranasal sinuses and mastoid air cells are well-aerated. Other: Soft tissue swelling is noted overlying the right posterior parietal calvarium. CT CERVICAL SPINE FINDINGS Alignment: Normal. Skull base and vertebrae: No acute fracture. No primary bone lesion or focal pathologic process. Soft tissues and spinal canal: No prevertebral fluid or swelling. No visible canal hematoma. Disc levels: Multilevel disc space narrowing is noted along the lower cervical spine, with scattered anterior and posterior disc osteophyte complexes. Upper chest: The thyroid gland is unremarkable in appearance. Scattered calcification is seen at the carotid bifurcations bilaterally, more prominent on the right. The visualized lung apices are clear. Other: No additional soft tissue abnormalities are seen. IMPRESSION: 1. No evidence of traumatic intracranial injury or fracture. 2. No evidence of fracture or subluxation along the cervical spine. 3. Soft tissue swelling overlying the right posterior parietal calvarium. 4. Moderate cortical volume loss and scattered small vessel ischemic microangiopathy. 5. Mild degenerative change along the cervical spine. 6. Scattered calcification at the carotid bifurcations bilaterally, more prominent on the right. Carotid ultrasound could be considered for further evaluation, when and as deemed clinically appropriate. Electronically Signed   By: Roanna Raider M.D.   On: 02/21/2017 22:12    Procedures Procedures (including critical care time)  Medications Ordered in ED Medications - No data to display   Initial Impression / Assessment and Plan / ED Course  I have reviewed the triage vital signs and the nursing notes.  Pertinent labs & imaging results that were available during my care of the patient were reviewed by me and considered in my medical  decision making (see chart for details).     Patient seen and examined. CT ordered. Will need wound cleaned and repaired.    Vital signs reviewed and are as follows: BP (!) 124/53 (BP Location: Right Arm)   Pulse 85   Temp 97 F (36.1 C) (Axillary)   Resp 18   SpO2 96%   12:51 AM Patient discussed with and seen by Dr. Rhunette Croft. Son and daughter updated on results.  Discussed checking UA as patient has a history of UTI. After discussion with family, decision made to defer at this time. Daughter to ask facility to check urine test through facility.  Extreme care taken to clean the blood out of the posterior scalp with assistance from RN and  daughter to position the patient. A small laceration was identified. After discussion with family, decision to close with 2 staples without anesthesia was made. Pt tolerated reasonably well.   Patient counseled on wound care. Patient counseled on need to return or see PCP/urgent care for suture removal in 7 days. Patient was urged to return to the Emergency Department urgently with worsening pain, swelling, expanding erythema especially if it streaks away from the affected area, fever, or if they have any other concerns. Patient verbalized understanding.   Family was counseled on head injury precautions and symptoms that should indicate their return to the ED.  These include severe worsening headache, vision changes, confusion, loss of consciousness, trouble walking, nausea & vomiting, or weakness/tingling in extremities.      Final Clinical Impressions(s) / ED Diagnoses   Final diagnoses:  Injury of head, initial encounter  Laceration of scalp, initial encounter   Head injury: CT negative, patient at baseline.  Scalp laceration: Identified and closed.  New Prescriptions Current Discharge Medication List       Renne Crigler, PA-C 02/22/17 1610    Derwood Kaplan, MD 02/23/17 1218

## 2017-02-21 NOTE — ED Notes (Signed)
Bed: FA21 Expected date:  Expected time:  Means of arrival:  Comments: Fall, head injury

## 2017-02-21 NOTE — ED Triage Notes (Signed)
Per EMS, pt is from [place] facility and fell in bathroom. Pt found by facility staff. Pt has injury to the back of her head, bleeding noted but controlled. Pt received  Haldol and  Versed prior to arrival. Hx dementia  126/72 p. 87 CBG 119

## 2017-02-22 NOTE — Discharge Instructions (Signed)
Please read and follow all provided instructions.  Your diagnoses today include:  1. Injury of head, initial encounter   2. Laceration of scalp, initial encounter     Tests performed today include:  CT scan of your head and neck that did not show any serious injury.  Vital signs. See below for your results today.   Medications prescribed:   None  Take any prescribed medications only as directed.  Home care instructions:  Follow any educational materials contained in this packet.  Follow-up instructions: Please follow-up with your primary care provider in 7 days for staple removal.  Follow-up tomorrow for urine test as we discussed.   Return instructions:  SEEK IMMEDIATE MEDICAL ATTENTION IF:  There is confusion or drowsiness (although children frequently become drowsy after injury).   You cannot awaken the injured person.   You have more than one episode of vomiting.   You notice dizziness or unsteadiness which is getting worse, or inability to walk.   You have convulsions or unconsciousness.   You experience severe, persistent headaches not relieved by Tylenol.  You cannot use arms or legs normally.   There are changes in pupil sizes. (This is the black center in the colored part of the eye)   There is clear or bloody discharge from the nose or ears.   You have change in speech, vision, swallowing, or understanding.   Localized weakness, numbness, tingling, or change in bowel or bladder control.  You have any other emergent concerns.  Additional Information: You have had a head injury which does not appear to require admission at this time.  Your vital signs today were: BP 133/63    Pulse 76    Temp 97 F (36.1 C) (Axillary)    Resp 14    SpO2 95%  If your blood pressure (BP) was elevated above 135/85 this visit, please have this repeated by your doctor within one month. --------------

## 2017-05-25 DEATH — deceased

## 2017-12-21 IMAGING — CT CT CERVICAL SPINE W/O CM
2 of 12 series · 6 of 33 positions shown, 7 images · non-contrast
Comparison: Head CT scan 01/21/2017. Head and maxillofacial CT scan
05/13/2016.

CLINICAL DATA: Status post fall today. Nosebleed. Initial
encounter.

EXAM:
CT HEAD WITHOUT CONTRAST
CT CERVICAL SPINE WITHOUT CONTRAST
TECHNIQUE: Multidetector CT imaging of the head and cervical spine was
performed following the standard protocol without intravenous
contrast. Multiplanar CT image reconstructions of the cervical spine
were also generated.

[Series 9: sagittal · sagittal · 0.30mm/px · 3 of 74 slices shown]
[im 19/74  bone]
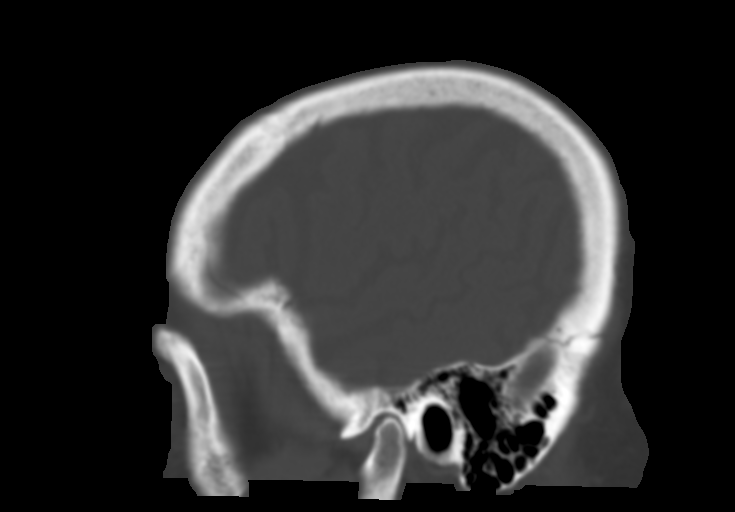
[im 37/74  bone]
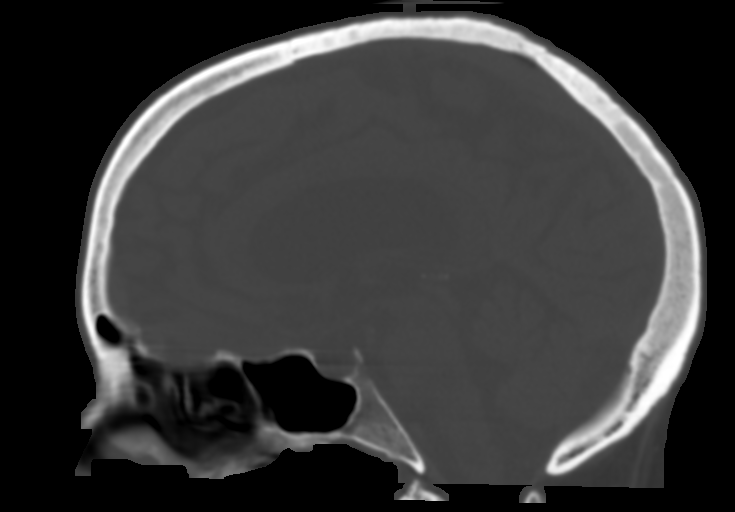
[im 55/74  bone]
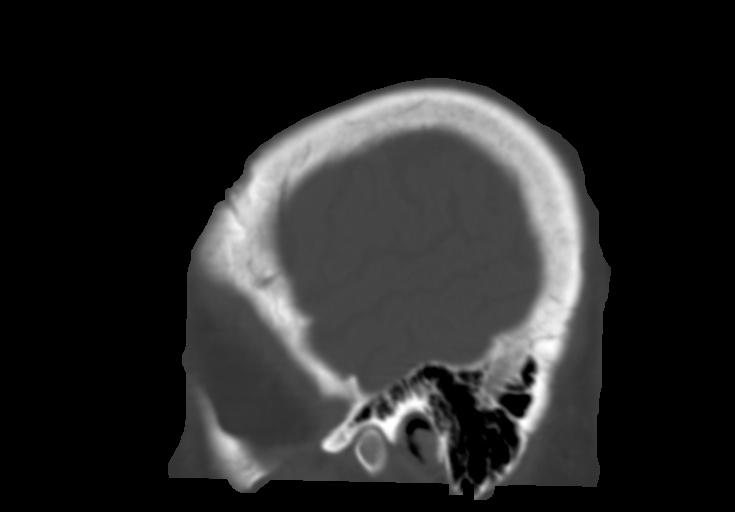

[Series 12: axial recon · axial · 0.23mm/px · z∈[-306,-126]mm · 3 of 97 slices shown, 4 images]
[im 1/97  soft-tissue]
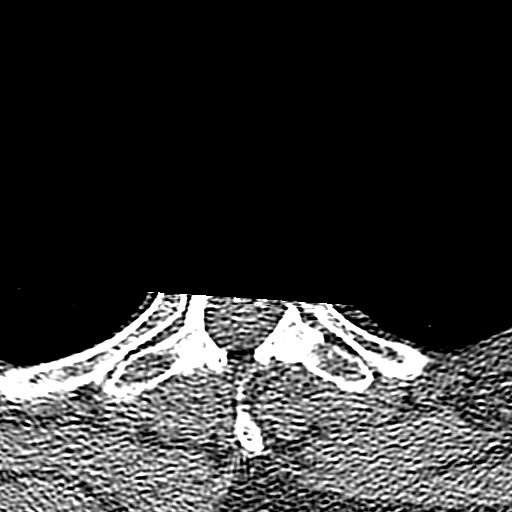
[im 1/97  bone]
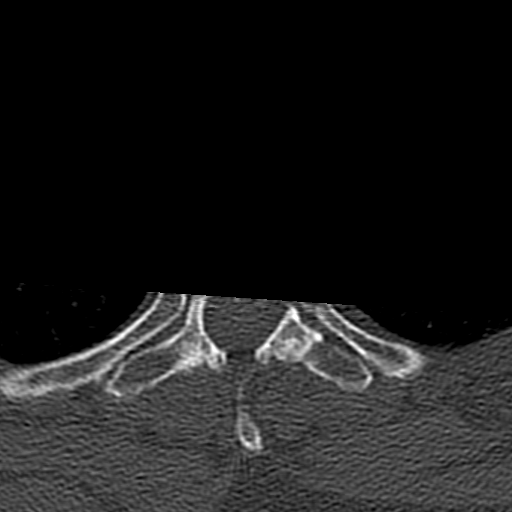
[im 49/97  bone]
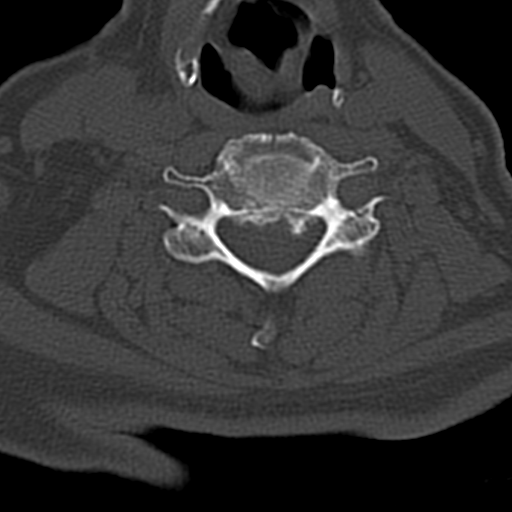
[im 97/97  bone]
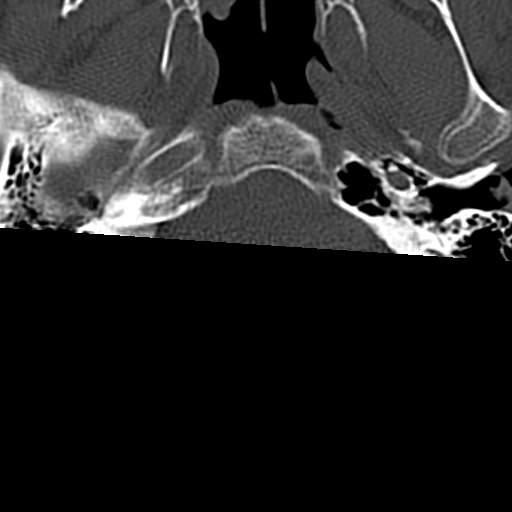

[6 of 33 positions shown; findings below may reference images not displayed]

FINDINGS: CT HEAD FINDINGS

Brain: There is cortical atrophy and some chronic microvascular
ischemic change. No evidence of acute intracranial abnormality
including hemorrhage, infarct, mass lesion, mass effect, midline
shift or abnormal extra-axial fluid collection. No hydrocephalus or
pneumocephalus.

Vascular: Atherosclerotic vascular disease noted.

Skull: Intact.

Sinuses/Orbits: No acute abnormality. Status post bilateral lens
extraction.

Other: None.

CT CERVICAL SPINE FINDINGS

Alignment: The study is again degraded by patient motion despite
repeat scanning. Reversal of the normal cervical lordosis is seen.

Skull base and vertebrae: No acute fracture. No primary bone lesion
or focal pathologic process.

Soft tissues and spinal canal: No prevertebral fluid or swelling. No
visible canal hematoma.

Disc levels: Loss of disc space height and endplate spurring appear
worst at C4-5, C5-6 and C6-7.

Upper chest: Lung apices are clear.

Other: None.
IMPRESSION: No acute intracranial abnormality.

Atrophy and chronic microvascular ischemic change.

Motion degraded cervical spine CT scan. No abnormality is seen. The
patient was scanned twice.

Atherosclerosis.

Cervical spondylosis.

## 2020-08-21 ENCOUNTER — Encounter: Payer: Self-pay | Admitting: Gastroenterology
